# Patient Record
Sex: Female | Born: 1955
Health system: Southern US, Community
[De-identification: ages and names within clinical notes are randomized; demographics above are authoritative.]

---

## 2015-09-30 ENCOUNTER — Other Ambulatory Visit: Payer: Self-pay | Admitting: Family Medicine

## 2015-09-30 ENCOUNTER — Other Ambulatory Visit (HOSPITAL_COMMUNITY)
Admission: RE | Admit: 2015-09-30 | Discharge: 2015-09-30 | Disposition: A | Discharge status: Home / Self Care | Payer: 59 | Source: Ambulatory Visit | Attending: Family Medicine | Admitting: Family Medicine

## 2015-09-30 DIAGNOSIS — Z01411 Encounter for gynecological examination (general) (routine) with abnormal findings: Secondary | ICD-10-CM | POA: Diagnosis present

## 2015-09-30 DIAGNOSIS — Z1151 Encounter for screening for human papillomavirus (HPV): Secondary | ICD-10-CM | POA: Diagnosis not present

## 2015-10-02 LAB — CYTOLOGY - PAP

## 2016-07-20 ENCOUNTER — Encounter: Payer: Self-pay | Admitting: Internal Medicine

## 2016-07-20 ENCOUNTER — Ambulatory Visit (INDEPENDENT_AMBULATORY_CARE_PROVIDER_SITE_OTHER): Payer: PRIVATE HEALTH INSURANCE | Admitting: Internal Medicine

## 2016-07-20 VITALS — BP 120/82 | HR 90 | Ht 66.0 in | Wt 206.0 lb

## 2016-07-20 DIAGNOSIS — E785 Hyperlipidemia, unspecified: Secondary | ICD-10-CM

## 2016-07-20 DIAGNOSIS — I10 Essential (primary) hypertension: Secondary | ICD-10-CM | POA: Diagnosis not present

## 2016-07-20 DIAGNOSIS — Z794 Long term (current) use of insulin: Secondary | ICD-10-CM | POA: Diagnosis not present

## 2016-07-20 DIAGNOSIS — E1165 Type 2 diabetes mellitus with hyperglycemia: Secondary | ICD-10-CM | POA: Diagnosis not present

## 2016-07-20 MED ORDER — EMPAGLIFLOZIN 25 MG PO TABS: 25.0000 mg | ORAL_TABLET | Freq: Every day | ORAL | 5 refills | Status: DC

## 2016-07-20 MED ORDER — SEMAGLUTIDE(0.25 OR 0.5MG/DOS) 2 MG/1.5ML ~~LOC~~ SOPN: 0.5000 mg | PEN_INJECTOR | SUBCUTANEOUS | 5 refills | Status: DC

## 2016-07-20 NOTE — Patient Instructions (Signed)
Please continue: - Lantus 30 units at bedtime.  Continue: - Ozempic 0.25 mg weekly x a total 4 weeks, then increase to 0.5 mg weekly  Start: - Jardiance 25 mg daily before b'fast.  If we cannot use Jardiance, ask your pharmacist about: - Farxiga - Invokana  If we cannot use any of the above, let me know to send Glipizide to your pharmacy.  Please let me know if the sugars are consistently <80 or >200.  Please return in 3 months with your sugar log.   PATIENT INSTRUCTIONS FOR TYPE 2 DIABETES:  **Please join MyChart!** - see attached instructions about how to join if you have not done so already.  DIET AND EXERCISE Diet and exercise is an important part of diabetic treatment.  We recommended aerobic exercise in the form of brisk walking (working between 40-60% of maximal aerobic capacity, similar to brisk walking) for 150 minutes per week (such as 30 minutes five days per week) along with 3 times per week performing 'resistance' training (using various gauge rubber tubes with handles) 5-10 exercises involving the major muscle groups (upper body, lower body and core) performing 10-15 repetitions (or near fatigue) each exercise. Start at half the above goal but build slowly to reach the above goals. If limited by weight, joint pain, or disability, we recommend daily walking in a swimming pool with water up to waist to reduce pressure from joints while allow for adequate exercise.    BLOOD GLUCOSES Monitoring your blood glucoses is important for continued management of your diabetes. Please check your blood glucoses 2-4 times a day: fasting, before meals and at bedtime (you can rotate these measurements - e.g. one day check before the 3 meals, the next day check before 2 of the meals and before bedtime, etc.).   HYPOGLYCEMIA (low blood sugar) Hypoglycemia is usually a reaction to not eating, exercising, or taking too much insulin/ other diabetes drugs.  Symptoms include tremors, sweating,  hunger, confusion, headache, etc. Treat IMMEDIATELY with 15 grams of Carbs: . 4 glucose tablets .  cup regular juice/soda . 2 tablespoons raisins . 4 teaspoons sugar . 1 tablespoon honey Recheck blood glucose in 15 mins and repeat above if still symptomatic/blood glucose <100.  RECOMMENDATIONS TO REDUCE YOUR RISK OF DIABETIC COMPLICATIONS: * Take your prescribed MEDICATION(S) * Follow a DIABETIC diet: Complex carbs, fiber rich foods, (monounsaturated and polyunsaturated) fats * AVOID saturated/trans fats, high fat foods, >2,300 mg salt per day. * EXERCISE at least 5 times a week for 30 minutes or preferably daily.  * DO NOT SMOKE OR DRINK more than 1 drink a day. * Check your FEET every day. Do not wear tightfitting shoes. Contact us if you develop an ulcer * See your EYE doctor once a year or more if needed * Get a FLU shot once a year * Get a PNEUMONIA vaccine once before and once after age 11 years  GOALS:  * Your Hemoglobin A1c of <7%  * fasting sugars need to be <130 * after meals sugars need to be <180 (2h after you start eating) * Your Systolic BP should be 140 or lower  * Your Diastolic BP should be 80 or lower  * Your HDL (Good Cholesterol) should be 40 or higher  * Your LDL (Bad Cholesterol) should be 100 or lower. * Your Triglycerides should be 150 or lower  * Your Urine microalbumin (kidney function) should be <30 * Your Body Mass Index should be 25 or lower  Please consider the following ways to cut down carbs and fat and increase fiber and micronutrients in your diet: - substitute whole grain for white bread or pasta - substitute brown rice for white rice - substitute 90-calorie flat bread pieces for slices of bread when possible - substitute sweet potatoes or yams for white potatoes - substitute humus for margarine - substitute tofu for cheese when possible - substitute almond or rice milk for regular milk (would not drink soy milk daily due to concern for  soy estrogen influence on breast cancer risk) - substitute dark chocolate for other sweets when possible - substitute water - can add lemon or orange slices for taste - for diet sodas (artificial sweeteners will trick your body that you can eat sweets without getting calories and will lead you to overeating and weight gain in the long run) - do not skip breakfast or other meals (this will slow down the metabolism and will result in more weight gain over time)  - can try smoothies made from fruit and almond/rice milk in am instead of regular breakfast - can also try old-fashioned (not instant) oatmeal made with almond/rice milk in am - order the dressing on the side when eating salad at a restaurant (pour less than half of the dressing on the salad) - eat as little meat as possible - can try juicing, but should not forget that juicing will get rid of the fiber, so would alternate with eating raw veg./fruits or drinking smoothies - use as little oil as possible, even when using olive oil - can dress a salad with a mix of balsamic vinegar and lemon juice, for e.g. - use agave nectar, stevia sugar, or regular sugar rather than artificial sweateners - steam or broil/roast veggies  - snack on veggies/fruit/nuts (unsalted, preferably) when possible, rather than processed foods - reduce or eliminate aspartame in diet (it is in diet sodas, chewing gum, etc) Read the labels!  Try to read Dr. Katherina Right book: "Program for Reversing Diabetes" for other ideas for healthy eating.

## 2016-07-20 NOTE — Progress Notes (Signed)
Patient ID: Destiny Munoz, female   DOB: [birth year], 61 y.o.   MRN: [id number]   HPI: Destiny Munoz is a 61 y.o.-year-old female, referred by her PCP, Dr. Kateri Munoz, for management of DM2, dx as GDM - 1995, then as DM2 in 2010, insulin-dependent since 05/2016, uncontrolled, without complications.  Last hemoglobin A1c was: 07/14/2016: HbA1c 10.1% 04/14/2016: HbA1c 11.3% 01/13/2016: HbA1c 10.4% 09/30/2015: HbA1c 10.9% No results found for: HGBA1C  Pt is on a regimen of: - Lantus 30 units at bedtime (last 2 weeks)  - Ozempic 0.25 mg weekly (last 2 weeks) >> samples, tolerates it well She tried metformin R and ER, but it caused GI upset (N/D) She ran out of Glipizide ER 10 mg.  Pt checks her sugars 2x a day and they are: - am: 158, 200-220 - 2h after b'fast: n/c - before lunch: n/c - 2h after lunch: n/c - before dinner: n/c - 2h after dinner: n/c - bedtime: 250-300s - nighttime: n/c No lows. Lowest sugar was 158; she has hypoglycemia awareness at 100.  Highest sugar was 323.  Glucometer: [employer]  Pt's meals are: - Breakfast: protein shake, cheese, sliced meat, toast - break: yoghurt - Lunch: sandwich or hamburger - break: banana - Dinner: chicken/steak + salad + starch - Dessert: cookies She does not like veggies, fruits.  - no CKD, last BUN/creatinine:  07/14/2016: Glucose 213, BUN/creatinine 10/0.67 01/13/2016 UACR <0.7 No results found for: BUN, CREATININE  On lisinopril. - last set of lipids: 09/30/2015: 207/110/49/136 No results found for: CHOL, HDL, LDLCALC, LDLDIRECT, TRIG, CHOLHDL  On pravastatin. - last eye exam was in Spring 2017. No DR.  - no numbness and tingling in her feet. She had a normal foot exam by PCP 07/14/2016.  Pt has FH of DM in mother.  ROS: Constitutional: no weight gain/loss, + fatigue, no subjective hyperthermia/hypothermia, + increased urination Eyes: no blurry vision, no xerophthalmia ENT: no sore throat, no nodules palpated in  throat, no dysphagia/odynophagia, no hoarseness Cardiovascular: no CP/SOB/palpitations/leg swelling Respiratory: no cough/SOB Gastrointestinal: no N/V/D/C/+ heartburn Musculoskeletal: no muscle/joint aches Skin: no rashes Neurological: no tremors/numbness/tingling/dizziness, + HA Psychiatric:+ depression/no anxiety  PMH: - DM2 - HTN - HL - Depression  No past surgical history on file.   Social History   Social History  . Marital status: single    Spouse name: N/A  . Number of children: 5   Occupational History  . Insurance documentation specialist   Social History Main Topics  . Smoking status: No  . Smokeless tobacco: No  . Alcohol use Wine, 2x weekly  . Drug use: No   Current Outpatient Prescriptions  Medication Sig Dispense Refill  . insulin glargine (LANTUS) 100 UNIT/ML injection Inject 35 Units into the skin at bedtime.    Marland Kitchen lisinopril (PRINIVIL,ZESTRIL) 10 MG tablet Take 10 mg by mouth daily.    . pravastatin (PRAVACHOL) 10 MG tablet Take 10 mg by mouth daily.    . Semaglutide (OZEMPIC) 0.25 or 0.5 MG/DOSE SOPN Inject 0.25 mg into the skin once a week.     No current facility-administered medications for this visit.    No Known Allergies   Family History  Problem Relation Age of Onset  . Diabetes Mother   . Hypertension Mother   . Hyperlipidemia Mother   . Heart disease Mother   . Hypertension Father   . Hyperlipidemia Father   . Heart disease Father   . Cancer Father   . Cancer Sister     PE:  BP 120/82 (BP Location: Left Arm, Patient Position: Sitting)   Pulse 90   Ht  (1.676 m)   Wt 206 lb (93.4 kg)   SpO2 94%   BMI 33.25 kg/m  Wt Readings from Last 3 Encounters:  07/20/16 206 lb (93.4 kg)   Constitutional: obese, in NAD Eyes: PERRLA, EOMI, no exophthalmos ENT: moist mucous membranes, no thyromegaly, no cervical lymphadenopathy Cardiovascular: RRR, No MRG Respiratory: CTA B Gastrointestinal: abdomen soft, NT, ND,  BS+ Musculoskeletal: no deformities, strength intact in all 4 Skin: moist, warm, no rashes Neurological: no tremor with outstretched hands, DTR normal in all 4  ASSESSMENT: 1. DM2, insulin-dependent, uncontrolled, without long term complications, but with hyperglycemia  PLAN:  1. Patient with long-standing, uncontrolled diabetes, previously only on oral antidiabetic regimen, which was insufficient, however, she recently accepted to start insulin and GLP 1 receptor agonist. She is on Lantus and Ozempic. Her sugars remained very high, in the 200-300 range, correlating with her very high HbA1c. - I advised her to continue on the same dose of Lantus, but increase Ozempic dose and start Jardiance. - we discussed about SEs of Jardiance, which are: dizziness (advised to be careful when stands from sitting position), decreased BP - usually not < normal (BP today is not low), and fungal UTIs (advised to let me know if develops one).  - given discount card for Jardiance and for Ozempic - We also discussed about improving diet. - I suggested to:  Patient Instructions  Please continue: - Lantus 30 units at bedtime.  Continue: - Ozempic 0.25 mg weekly x a total 4 weeks, then increase to 0.5 mg weekly  Start: - Jardiance 25 mg daily before b'fast.  If we cannot use Jardiance, ask your pharmacist about: - Farxiga - Invokana  If we cannot use any of the above, let me know to send Glipizide to your pharmacy.  Please let me know if the sugars are consistently <80 or >200.  Please return in 3 months with your sugar log.   - Continue checking sugars at different times of the day - check 2 times a day, rotating checks - given sugar log and advised how to fill it and to bring it at next appt  - given foot care handout and explained the principles  - given instructions for hypoglycemia management "15-15 rule"  - advised for yearly eye exams  - Return to clinic in 3 mo with sugar log   Carlus Pavlov, MD PhD [employer] [facility]

## 2016-11-10 ENCOUNTER — Encounter: Payer: Self-pay | Admitting: Internal Medicine

## 2016-11-10 ENCOUNTER — Ambulatory Visit (INDEPENDENT_AMBULATORY_CARE_PROVIDER_SITE_OTHER): Payer: PRIVATE HEALTH INSURANCE | Admitting: Internal Medicine

## 2016-11-10 VITALS — BP 132/82 | HR 81 | Ht 66.0 in | Wt 205.0 lb

## 2016-11-10 DIAGNOSIS — Z794 Long term (current) use of insulin: Secondary | ICD-10-CM | POA: Diagnosis not present

## 2016-11-10 DIAGNOSIS — E1165 Type 2 diabetes mellitus with hyperglycemia: Secondary | ICD-10-CM

## 2016-11-10 LAB — POCT GLYCOSYLATED HEMOGLOBIN (HGB A1C): HEMOGLOBIN A1C: 7.9

## 2016-11-10 NOTE — Patient Instructions (Addendum)
Please continue: - Novolin N but split the dose to 15 units before b'fast and 20 before bedtime   Please increase: - Ozempic to 0.5 mg weekly   Please return in 3 months with your sugar log.

## 2016-11-10 NOTE — Progress Notes (Signed)
Patient ID: Destiny Munoz, female   DOB: 1955/09/25, 61 y.o.   MRN: [id number]   HPI: Radha Lager is a 61 y.o.-year-old female, returning for follow-up for DM2, dx as GDM - 1995, then as DM2 in 2010, insulin-dependent since 05/2016, uncontrolled, without long-term complications. Last visit 3.5 mo ago.  She had a URI in July >> sugars higher >> now much better.  Last hemoglobin A1c was: 07/14/2016: HbA1c 10.1% 04/14/2016: HbA1c 11.3% 01/13/2016: HbA1c 10.4% 09/30/2015: HbA1c 10.9% No results found for: HGBA1C  Pt was on a regimen of: - Lantus 30 units at bedtime (last 2 weeks)  - Ozempic 0.25 mg weekly (last 2 weeks) >> samples, tolerates it well She tried metformin R and ER, but it caused GI upset (N/D) She ran out of Glipizide ER 10 mg.  At last visit, we changed to: - Lantus 30 units at bedtime >> Novolin N 35 units at bedtime (had to change b/c $) - Ozempic 0.25 alternating 0.5 mg weekly (did not understand to stay on the higher dose) We tried Jardiance 25 mg >> $.  Pt checks her sugars 2x a day and they are better in last month: - am: 158, 200-220 >> 108-158 (but 240-270 when sick) - 2h after b'fast: n/c - before lunch: n/c - 2h after lunch: n/c - before dinner: n/c >> 120-127 - 2h after dinner: n/c - bedtime: 250-300s >> 148-246 - nighttime: n/c No lows. Lowest sugar was 158 >> 108; she has hypoglycemia awareness at 100.  Highest sugar was 323 >> 489.  Glucometer: [employer]  Pt's meals are: - Breakfast: protein shake, cheese, sliced meat, toast - break: yoghurt - Lunch: sandwich or hamburger - break: banana - Dinner: chicken/steak + salad + starch - Dessert: cookies She does not like veggies, fruits.  - No CKD, last BUN/creatinine:  07/14/2016: Glucose 213, BUN/creatinine 10/0.67 01/13/2016 UACR <0.7 No results found for: BUN, CREATININE  On Lisinopril. - last set of lipids: Had labs 1 mo ago >> will ask for records from PCP 09/30/2015:  207/110/49/136 No results found for: CHOL, HDL, LDLCALC, LDLDIRECT, TRIG, CHOLHDL  On Pravastatin increased from 10 to 20 mg. - last eye exam was in Spring 2017 >> No DR.  - denies numbness and tingling in her feet. She had a normal foot exam by PCP 07/14/2016.  Pt has FH of DM in mother.  ROS: Constitutional: no weight gain/no weight loss, + fatigue, no subjective hyperthermia, no subjective hypothermia Eyes: no blurry vision, no xerophthalmia ENT: + sore throat, no nodules palpated in throat, no dysphagia, no odynophagia, + hoarseness Cardiovascular: no CP/no SOB/no palpitations/+ leg swelling Respiratory: + cough/no SOB/no wheezing Gastrointestinal: no N/no V/no D/no C/no acid reflux Musculoskeletal: + muscle aches/no joint aches Skin: no rashes, no hair loss Neurological: no tremors/no numbness/no tingling/no dizziness, + HA  I reviewed pt's medications, allergies, PMH, social hx, family hx, and changes were documented in the history of present illness. Otherwise, unchanged from my initial visit note.  PMH: - DM2 - HTN - HL - Depression  No past surgical history on file.   Social History   Social History  . Marital status: single    Spouse name: N/A  . Number of children: 5   Occupational History  . Insurance documentation specialist   Social History Main Topics  . Smoking status: No  . Smokeless tobacco: No  . Alcohol use Wine, 2x weekly  . Drug use: No   Current Outpatient Prescriptions  Medication Sig  Dispense Refill  . insulin NPH Human (HUMULIN N,NOVOLIN N) 100 UNIT/ML injection Inject 35 Units into the skin.    Marland Kitchen lisinopril (PRINIVIL,ZESTRIL) 10 MG tablet Take 10 mg by mouth daily.    . pravastatin (PRAVACHOL) 10 MG tablet Take 20 mg by mouth daily.     . Semaglutide (OZEMPIC) 0.25 or 0.5 MG/DOSE SOPN Inject 0.5 mg into the skin once a week. 2 pen 5  . empagliflozin (JARDIANCE) 25 MG TABS tablet Take 25 mg by mouth daily. (Patient not taking: Reported on  11/10/2016) 30 tablet 5  . insulin glargine (LANTUS) 100 UNIT/ML injection Inject 35 Units into the skin at bedtime.     No current facility-administered medications for this visit.    No Known Allergies   Family History  Problem Relation Age of Onset  . Diabetes Mother   . Hypertension Mother   . Hyperlipidemia Mother   . Heart disease Mother   . Hypertension Father   . Hyperlipidemia Father   . Heart disease Father   . Cancer Father   . Cancer Sister     PE: BP 132/82 (BP Location: Left Arm, Patient Position: Sitting)   Pulse 81   Ht 5\' 6"  (1.676 m)   Wt 205 lb (93 kg)   SpO2 96%   BMI 33.09 kg/m  Wt Readings from Last 3 Encounters:  11/10/16 205 lb (93 kg)  07/20/16 206 lb (93.4 kg)   Constitutional: obese, in NAD Eyes: PERRLA, EOMI, no exophthalmos ENT: moist mucous membranes, no thyromegaly, no cervical lymphadenopathy Cardiovascular: RRR, + 1/6 SEM, no RG Respiratory: CTA B Gastrointestinal: abdomen soft, NT, ND, BS+ Musculoskeletal: no deformities, strength intact in all 4 Skin: moist, warm, no rashes Neurological: no tremor with outstretched hands, DTR normal in all 4  ASSESSMENT: 1. DM2, insulin-dependent, uncontrolled, without long term complications, but with hyperglycemia  2. HL  PLAN:  1. Patient with long-standing, uncontrolled DM, much improved since last visit except for a period of 1 mo in July, whe she had a URI >> sugars got to 300s and even had 1x 400. - she could not obtain the recommended SGLT2 inh b/c cost >> not necessary for now. - she could not continue Lantus b/c cost >> now on Novolin N but takes it all at night >> explained that this is a 2x a day insulin >> will split the dose - also, she was alternating b/w 0.25 and 0.5 mg Ozempic >> will increase the dose to only 0.5 mg weekly and move this to am as she was taking it at night - I suggested to:  Patient Instructions  Please continue: - Novolin N but split the dose to 15 units before  b'fast and 20 before bedtime   Please increase: - Ozempic to 0.5 mg weekly   Please return in 3 months with your sugar log.   - today, HbA1c is 7.9% (great decrease!) - continue checking sugars at different times of the day - check 2x a day, rotating checks - advised for yearly eye exams >> she is due - Return to clinic in 3 mo with sugar log   2. HL  - had recent Lipid panel by PCP >> will try to obtain records - her Pravastatin was increased from 10 >> 20 mg per day.  Reviewed records from PCP: 10/04/2016:  Lipids: 178/90/42/117 BUN/Cr 10/0.76, Glu 277  Carlus Pavlov, MD PhD [facility]

## 2016-11-10 NOTE — Addendum Note (Signed)
Addended by: Darene Lamer T on: 11/10/2016 08:51 AM   Modules accepted: Orders

## 2017-02-13 ENCOUNTER — Ambulatory Visit (INDEPENDENT_AMBULATORY_CARE_PROVIDER_SITE_OTHER): Payer: PRIVATE HEALTH INSURANCE | Admitting: Internal Medicine

## 2017-02-13 ENCOUNTER — Encounter: Payer: Self-pay | Admitting: Internal Medicine

## 2017-02-13 VITALS — BP 124/80 | HR 85 | Wt 208.0 lb

## 2017-02-13 DIAGNOSIS — Z794 Long term (current) use of insulin: Secondary | ICD-10-CM

## 2017-02-13 DIAGNOSIS — E1165 Type 2 diabetes mellitus with hyperglycemia: Secondary | ICD-10-CM | POA: Diagnosis not present

## 2017-02-13 DIAGNOSIS — E785 Hyperlipidemia, unspecified: Secondary | ICD-10-CM | POA: Insufficient documentation

## 2017-02-13 LAB — POCT GLYCOSYLATED HEMOGLOBIN (HGB A1C): Hemoglobin A1C: 6.7

## 2017-02-13 NOTE — Progress Notes (Signed)
Patient ID: Destiny Munoz, female   DOB: 1955/11/06, 61 y.o.   MRN: [id number]   HPI: Destiny Munoz is a 61 y.o.-year-old female, returning for follow-up for DM2, dx as GDM - 1995, then as DM2 in 2010, insulin-dependent since 05/2016, uncontrolled, without long-term complications. Last visit 3 mo ago.  Last hemoglobin A1c was: Lab Results  Component Value Date   HGBA1C 7.9 11/10/2016  07/14/2016: HbA1c 10.1% 04/14/2016: HbA1c 11.3% 01/13/2016: HbA1c 10.4% 09/30/2015: HbA1c 10.9%  Pt was on a regimen of: - Lantus 30 units at bedtime (last 2 weeks)  - Ozempic 0.25 mg weekly (last 2 weeks) >> samples, tolerates it well She tried metformin R and ER, but it caused GI upset (N/D) She ran out of Glipizide ER 10 mg.  At last visit, we changed to: - Lantus 30 units at bedtime >> Novolin N 35 units at bedtime (had to change b/c $) >> 15 units in am and 20 units at bedtime - Ozempic 0.25 alternating 0.5 mg weekly (did not understand to stay on the higher dose) >> stopped as ran out We tried Jardiance 25 mg >> $.  Pt checks her sugars 2x a day: - am: 158, 200-220 >> 108-158 (but 240-270 when sick) >> 118-166, 194 - 2h after b'fast: n/c - before lunch: n/c >> 11/2016: 107-129 - 2h after lunch: n/c - before dinner: n/c >> 120-127 >> n/c - 2h after dinner: n/c - bedtime: 250-300s >> 148-246 >> 153-275 - nighttime: n/c Lowest sugar was 158 >> 108 >> 118; she has hypoglycemia awareness at 100.  Highest sugar was 323 >> 489 >> 275.  Glucometer: [employer]  Pt's meals are: - Breakfast: protein shake, cheese, sliced meat, toast - break: yoghurt - Lunch: sandwich or hamburger - break: banana - Dinner: chicken/steak + salad + starch - Dessert: cookies She does not like veggies, fruits.  - No CKD, last BUN/creatinine:  07/14/2016: Glucose 213, BUN/creatinine 10/0.67 01/13/2016; UACR <0.7 No results found for: BUN, CREATININE  On Lisinopril. - last set of lipids: 10/04/2016:   Lipids: 178/90/42/117 BUN/Cr 10/0.76, Glu 277 09/30/2015: 207/110/49/136 No results found for: CHOL, HDL, LDLCALC, LDLDIRECT, TRIG, CHOLHDL  On Pravastatin. - last eye exam was in Spring 2017 >> No DR.  - denies numbness and tingling in her feet. She had a normal foot exam by PCP 07/14/2016.  Pt has FH of DM in mother.  ROS: Constitutional: no weight gain/no weight loss, + fatigue, no subjective hyperthermia, no subjective hypothermia Eyes: no blurry vision, no xerophthalmia ENT: no sore throat, no nodules palpated in throat, no dysphagia, no odynophagia, no hoarseness Cardiovascular: no CP/no SOB/no palpitations/no leg swelling Respiratory: no cough/no SOB/no wheezing Gastrointestinal: + N/+ V/no D/no C/no acid reflux Musculoskeletal: no muscle aches/no joint aches Skin: no rashes, no hair loss Neurological: no tremors/no numbness/no tingling/no dizziness  I reviewed pt's medications, allergies, PMH, social hx, family hx, and changes were documented in the history of present illness. Otherwise, unchanged from my initial visit note.   PMH: - DM2 - HTN - HL - Depression  No past surgical history on file.   Social History   Social History  . Marital status: single    Spouse name: N/A  . Number of children: 5   Occupational History  . Insurance documentation specialist   Social History Main Topics  . Smoking status: No  . Smokeless tobacco: No  . Alcohol use Wine, 2x weekly  . Drug use: No   Current Outpatient Medications  Medication Sig Dispense Refill  . insulin NPH Human (HUMULIN N,NOVOLIN N) 100 UNIT/ML injection Inject 35 Units into the skin.    Marland Kitchen. lisinopril (PRINIVIL,ZESTRIL) 10 MG tablet Take 10 mg by mouth daily.    . pravastatin (PRAVACHOL) 10 MG tablet Take 20 mg by mouth daily.     . Semaglutide (OZEMPIC) 0.25 or 0.5 MG/DOSE SOPN Inject 0.5 mg into the skin once a week. (Patient not taking: Reported on 02/13/2017) 2 pen 5   No current  facility-administered medications for this visit.    No Known Allergies   Family History  Problem Relation Age of Onset  . Diabetes Mother   . Hypertension Mother   . Hyperlipidemia Mother   . Heart disease Mother   . Hypertension Father   . Hyperlipidemia Father   . Heart disease Father   . Cancer Father   . Cancer Sister     PE: BP 124/80 (BP Location: Left Arm, Patient Position: Sitting)   Pulse 85   Wt 208 lb (94.3 kg)   SpO2 97%   BMI 33.57 kg/m  Wt Readings from Last 3 Encounters:  02/13/17 208 lb (94.3 kg)  11/10/16 205 lb (93 kg)  07/20/16 206 lb (93.4 kg)   Constitutional: overweight, in NAD Eyes: PERRLA, EOMI, no exophthalmos ENT: moist mucous membranes, no thyromegaly, no cervical lymphadenopathy Cardiovascular: RRR, No RG, + 1/6 SEM Respiratory: CTA B Gastrointestinal: abdomen soft, NT, ND, BS+ Musculoskeletal: no deformities, strength intact in all 4 Skin: moist, warm, no rashes Neurological: no tremor with outstretched hands, DTR normal in all 4  ASSESSMENT: 1. DM2, insulin-dependent, uncontrolled, without long term complications, but with hyperglycemia  2. HL  PLAN:  1. Patient with long standing, uncontrolled DM, with better ctrl after starting Ozempic, but unfortunately she ran out of this last mo >> sugars progressively higher (even high 200s), especially in the evening. For now, will increase am insulin N and when ready to restart the GLP1 R agonist (after the 1st of the year) >> will reduce the dose back. - I suggested to:  Patient Instructions  Please change NPH insulin dose: - 20 units 2x a day  When you are ready to start Ozempic, start at 0.25 mg weekly x 2-4 weeks, then increase to 0.5 mg weekly. In the same time, decrease am NPH back to 15 units.  Please return in 3 months with your sugar log.   - today, HbA1c is 6.7% (great!) - continue checking sugars at different times of the day - check 1x a day, rotating checks - advised for  yearly eye exams >> she is not UTD - Return to clinic in 3 mo with sugar log   2. HL  - Reviewed recent Lipids by PCP >> all fractions better! - continue Pravastatin 20 >> no SEs  Carlus Pavlovristina Rashena Dowling, MD PhD [facility]

## 2017-02-13 NOTE — Addendum Note (Signed)
Addended by: Darene LamerHOMPSON, Zariana Strub T on: 02/13/2017 09:17 AM   Modules accepted: Orders

## 2017-02-13 NOTE — Patient Instructions (Signed)
Please change NPH insulin dose: - 20 units 2x a day  When you are ready to start Ozempic, start at 0.25 mg weekly x 2-4 weeks, then increase to 0.5 mg weekly. In the same time, decrease am NPH back to 15 units.  Please return in 3 months with your sugar log.

## 2017-05-16 ENCOUNTER — Encounter: Payer: Self-pay | Admitting: Internal Medicine

## 2017-05-16 ENCOUNTER — Ambulatory Visit (INDEPENDENT_AMBULATORY_CARE_PROVIDER_SITE_OTHER): Payer: PRIVATE HEALTH INSURANCE | Admitting: Internal Medicine

## 2017-05-16 VITALS — BP 136/78 | HR 104 | Ht 66.0 in | Wt 216.8 lb

## 2017-05-16 DIAGNOSIS — E785 Hyperlipidemia, unspecified: Secondary | ICD-10-CM

## 2017-05-16 DIAGNOSIS — Z794 Long term (current) use of insulin: Secondary | ICD-10-CM | POA: Diagnosis not present

## 2017-05-16 DIAGNOSIS — E1165 Type 2 diabetes mellitus with hyperglycemia: Secondary | ICD-10-CM

## 2017-05-16 LAB — POCT GLYCOSYLATED HEMOGLOBIN (HGB A1C): Hemoglobin A1C: 8.1

## 2017-05-16 MED ORDER — SEMAGLUTIDE(0.25 OR 0.5MG/DOS) 2 MG/1.5ML ~~LOC~~ SOPN: 0.5000 mg | PEN_INJECTOR | SUBCUTANEOUS | 5 refills | Status: DC

## 2017-05-16 NOTE — Patient Instructions (Addendum)
Please continue NPH insulin dose: - 20 units 2x a day  Start Ozempic at 0.25 mg weekly x 2-4 weeks, then increase to 0.5 mg weekly. In the same time, decrease am NPH back to 15 units 2x a day.  Please return in 3 months with your sugar log.

## 2017-05-16 NOTE — Progress Notes (Signed)
Patient ID: Destiny Munoz, female   DOB: 29-Dec-1955, 62 y.o.   MRN: [id number]   HPI: Destiny Munoz is a 62 y.o.-year-old female, returning for follow-up for DM2, dx as GDM - 1995, then as DM2 in 2010, insulin-dependent since 05/2016, uncontrolled, without long-term complications. Last visit 3 months ago.  Last hemoglobin A1c was: Lab Results  Component Value Date   HGBA1C 6.7 02/13/2017   HGBA1C 7.9 11/10/2016  07/14/2016: HbA1c 10.1% 04/14/2016: HbA1c 11.3% 01/13/2016: HbA1c 10.4% 09/30/2015: HbA1c 10.9%  Pt was on a regimen of: - Lantus 30 units at bedtime - Ozempic 0.25 mg weekly She tried metformin R and ER, but it caused GI upset (N/D) She ran out of Glipizide ER 10 mg.  She is now on: - Lantus 30 units at bedtime >> Novolin N 35 units at bedtime (had to change b/c $) >> 20 units 2x a day  Did not restart as I advised her at last visit We tried Jardiance 25 mg >> $.  Pt checks her sugars 2x a day: - am: 108-158 (but 240-270 when sick) >> 118-166, 194 >> 144-160 - 2h after b'fast: n/c - before lunch: n/c >> 11/2016: 107-129 >> n/c - 2h after lunch: n/c - before dinner: n/c >> 120-127 >> n/c - 2h after dinner: n/c - bedtime: 250-300s >> 148-246 >> 153-275 >> 260s - nighttime: n/c Lowest sugar was 158 >> 108 >> 118 >> 128; she has hypoglycemia awareness at 100. Highest sugar was 323 >> 489 >> 275 >> 300s after meals.  Glucometer: [employer]  Pt's meals are: - Breakfast: protein shake, cheese, sliced meat, toast - break: yoghurt - Lunch: sandwich or hamburger - break: banana - Dinner: chicken/steak + salad + starch - Dessert: cookies She does not like veggies, fruits.  -No CKD, last BUN/creatinine:  07/14/2016: Glucose 213, BUN/creatinine 10/0.67 01/13/2016; UACR <0.7 No results found for: BUN, CREATININE  On lisinopril. -+ HL; last set of lipids: 10/04/2016:  Lipids: 178/90/42/117 BUN/Cr 10/0.76, Glu 277 09/30/2015: 207/110/49/136 No results found  for: CHOL, HDL, LDLCALC, LDLDIRECT, TRIG, CHOLHDL  On pravastatin. - last eye exam was in spring 2017: No DR - No numbness and tingling in her feet. She had a normal foot exam by PCP 07/14/2016.  ROS: Constitutional: + weight gain/no weight loss, + fatigue, no subjective hyperthermia, no subjective hypothermia Eyes: no blurry vision, no xerophthalmia ENT: + sore throat, no nodules palpated in throat, no dysphagia, no odynophagia, no hoarseness Cardiovascular: no CP/no SOB/no palpitations/no leg swelling Respiratory: + cough/no SOB/no wheezing Gastrointestinal: no N/no V/no D/no C/no acid reflux Musculoskeletal: no muscle aches/+ joint aches Skin: no rashes, no hair loss Neurological: no tremors/no numbness/no tingling/no dizziness  I reviewed pt's medications, allergies, PMH, social hx, family hx, and changes were documented in the history of present illness. Otherwise, unchanged from my initial visit note.  PMH: - DM2 - HTN - HL - Depression  No past surgical history on file.   Social History   Social History  . Marital status: single    Spouse name: N/A  . Number of children: 5   Occupational History  . Insurance documentation specialist   Social History Main Topics  . Smoking status: No  . Smokeless tobacco: No  . Alcohol use Wine, 2x weekly  . Drug use: No   Current Outpatient Medications  Medication Sig Dispense Refill  . insulin NPH Human (HUMULIN N,NOVOLIN N) 100 UNIT/ML injection Inject 35 Units into the skin.    Marland Kitchen lisinopril (  PRINIVIL,ZESTRIL) 10 MG tablet Take 10 mg by mouth daily.    . pravastatin (PRAVACHOL) 10 MG tablet Take 20 mg by mouth daily.     . Semaglutide (OZEMPIC) 0.25 or 0.5 MG/DOSE SOPN Inject 0.5 mg into the skin once a week. (Patient not taking: Reported on 02/13/2017) 2 pen 5   No current facility-administered medications for this visit.    No Known Allergies   Family History  Problem Relation Age of Onset  . Diabetes Mother   .  Hypertension Mother   . Hyperlipidemia Mother   . Heart disease Mother   . Hypertension Father   . Hyperlipidemia Father   . Heart disease Father   . Cancer Father   . Cancer Sister    Pt has FH of DM in mother.  PE: BP 136/78   Pulse (!) 104   Ht 5\' 6"  (1.676 m)   Wt 216 lb 12.8 oz (98.3 kg)   BMI 34.99 kg/m  Wt Readings from Last 3 Encounters:  05/16/17 216 lb 12.8 oz (98.3 kg)  02/13/17 208 lb (94.3 kg)  11/10/16 205 lb (93 kg)   Constitutional: overweight, in NAD Eyes: PERRLA, EOMI, no exophthalmos ENT: moist mucous membranes, no thyromegaly, no cervical lymphadenopathy Cardiovascular: tachycardia, RR, No RG, + 1/6 SEM Respiratory: CTA B Gastrointestinal: abdomen soft, NT, ND, BS+ Musculoskeletal: no deformities, strength intact in all 4 Skin: moist, warm, no rashes Neurological: no tremor with outstretched hands, DTR normal in all 4  ASSESSMENT: 1. DM2, insulin-dependent, uncontrolled, without long term complications, but with hyperglycemia  2. HL  PLAN:  1. Patient with long-standing, uncontrolled, diabetes, with with better control after starting Ozempic,  however, she was off the medication at last visit as she ran out and her sugars increase significantly especially in the evening.  We increased her NPH insulin until she was ready to restart the GLP-1 receptor agonist. However, she did not restart as advised at last visit  - she cont'd only on NPH. Sugars are higher and she gained weight >> will start Ozempic now and try to reduce the NPH doses. - I suggested to:  Patient Instructions  Please continue NPH insulin dose: - 20 units 2x a day  Start Ozempic at 0.25 mg weekly x 2-4 weeks, then increase to 0.5 mg weekly. In the same time, decrease am NPH back to 15 units 2x a day.  Please return in 3 months with your sugar log.   - today, HbA1c is 8.1% (higher) - continue checking sugars at different times of the day - check 2x a day, rotating checks - advised  for yearly eye exams >> she is not UTD - Return to clinic in 3 mo with sugar log    2. HL  - Reviewed lipid panel obtained by PCP in 09/2016: All fractions are better, however, LDL is still above goal  - She continues pravastatin 20 without side effects  Carlus Pavlovristina Valinda Fedie, MD PhD [facility]

## 2017-08-16 ENCOUNTER — Encounter: Payer: Self-pay | Admitting: Internal Medicine

## 2017-08-16 ENCOUNTER — Ambulatory Visit (INDEPENDENT_AMBULATORY_CARE_PROVIDER_SITE_OTHER): Payer: PRIVATE HEALTH INSURANCE | Admitting: Internal Medicine

## 2017-08-16 VITALS — BP 140/80 | HR 76 | Ht 66.0 in | Wt 217.0 lb

## 2017-08-16 DIAGNOSIS — E1165 Type 2 diabetes mellitus with hyperglycemia: Secondary | ICD-10-CM | POA: Diagnosis not present

## 2017-08-16 DIAGNOSIS — E785 Hyperlipidemia, unspecified: Secondary | ICD-10-CM

## 2017-08-16 DIAGNOSIS — Z794 Long term (current) use of insulin: Secondary | ICD-10-CM | POA: Diagnosis not present

## 2017-08-16 LAB — POCT GLYCOSYLATED HEMOGLOBIN (HGB A1C): Hemoglobin A1C: 7.9 % — AB (ref 4.0–5.6)

## 2017-08-16 NOTE — Progress Notes (Signed)
Patient ID: Destiny Munoz, female   DOB: 12/03/1955, 62 y.o.   MRN: [id number]   HPI: Destiny Munoz is a 62 y.o.-year-old female, returning for follow-up for DM2, dx as GDM - 1995, then as DM2 in 2010, insulin-dependent since 05/2016, uncontrolled, without long-term complications. Last visit 3 months ago.  She started to change her diet: no sweet drinks, no french fries. She skipped more meals in 06/2017 >> sugars better then.  However, this month, they are higher, especially in the morning.  Last hemoglobin A1c was: Lab Results  Component Value Date   HGBA1C 8.1 05/16/2017   HGBA1C 6.7 02/13/2017   HGBA1C 7.9 11/10/2016  07/14/2016: HbA1c 10.1% 04/14/2016: HbA1c 11.3% 01/13/2016: HbA1c 10.4% 09/30/2015: HbA1c 10.9%  She is on: - NPH 15 units in am and 20 units at night She could not start Ozempic 0.5 mg weekly b/c price >> 750$ We tried Jardiance 25 mg >> $. She tried metformin R and ER, but it caused GI upset (N/D) She ran out of Glipizide ER 10 mg.   She was on Lantus 30 units at bedtime in the past but could not afford it anymore   Pt checks her sugars 2X a day: - am: 118-166, 194 >> 144-160 >> 104, 120-160 - 2h after b'fast: n/c - before lunch:  107-129 >> n/c - 2h after lunch: n/c - before dinner: n/c >> 120-127 >> n/c - 2h after dinner: n/c - bedtime: 148-246 >> 153-275 >> 260s >> 150-180 - nighttime: n/c Lowest sugar was 128 >> 104; she has hypoglycemia awareness at 100. Highest sugar was 300s >> 298.  Glucometer: [employer]  Pt's meals are: - Breakfast: protein shake, cheese, sliced meat, toast - break: yoghurt - Lunch: sandwich or hamburger - break: banana - Dinner: chicken/steak + salad + starch - Dessert: cookies She does not like veggies, fruits.  - no CKD, last BUN/creatinine:  10/04/2016: BUN/Cr 10/0.76, Glu 277 07/14/2016: BUN/Cr 10/0.67, Glu 213 01/13/2016: UACR <0.7 No results found for: BUN, CREATININE  On lisinopril. - + HL; last set of  lipids: 10/04/2016: 178/90/42/117 09/30/2015: 207/110/49/136 No results found for: CHOL, HDL, LDLCALC, LDLDIRECT, TRIG, CHOLHDL  On pravastatin. - last eye exam was in s spring 2017: No DR. she will have new insurance soon and can schedule this. - no numbness and tingling in her feet. She had a normal foot exam by PCP 06/2016.  She had a recent appointment with a podiatrist for a callus.  ROS: Constitutional: no weight gain/no weight loss,+ fatigue, no subjective hyperthermia, no subjective hypothermia Eyes: no blurry vision, no xerophthalmia ENT: no sore throat, no nodules palpated in throat, no dysphagia, no odynophagia, no hoarseness Cardiovascular: no CP/no SOB/no palpitations/+ leg swelling Respiratory: + cough/no SOB/no wheezing Gastrointestinal: no N/no V/no D/no C/+ acid reflux Musculoskeletal: no muscle aches/no joint aches Skin: + rash - poison ivy, no hair loss Neurological: no tremors/no numbness/no tingling/no dizziness  I reviewed pt's medications, allergies, PMH, social hx, family hx, and changes were documented in the history of present illness. Otherwise, unchanged from my initial visit note. PMH: - DM2 - HTN - HL - Depression  History reviewed. No pertinent surgical history.   Social History   Social History  . Marital status: single    Spouse name: N/A  . Number of children: 5   Occupational History  . Insurance documentation specialist   Social History Main Topics  . Smoking status: No  . Smokeless tobacco: No  . Alcohol use Wine, 2x  weekly  . Drug use: No   Current Outpatient Medications  Medication Sig Dispense Refill  . insulin NPH Human (HUMULIN N,NOVOLIN N) 100 UNIT/ML injection Inject 35 Units into the skin.    Marland Kitchen lisinopril (PRINIVIL,ZESTRIL) 10 MG tablet Take 10 mg by mouth daily.    . pravastatin (PRAVACHOL) 10 MG tablet Take 20 mg by mouth daily.     . Semaglutide (OZEMPIC) 0.25 or 0.5 MG/DOSE SOPN Inject 0.5 mg into the skin once a week.  2 pen 5   No current facility-administered medications for this visit.    No Known Allergies   Family History  Problem Relation Age of Onset  . Diabetes Mother   . Hypertension Mother   . Hyperlipidemia Mother   . Heart disease Mother   . Hypertension Father   . Hyperlipidemia Father   . Heart disease Father   . Cancer Father   . Cancer Sister    Pt has FH of DM in mother.  PE: BP 140/80 (BP Location: Right Arm, Patient Position: Sitting, Cuff Size: Large)   Pulse 76   Ht  (1.676 m)   Wt 217 lb (98.4 kg)   SpO2 95%   BMI 35.02 kg/m  Wt Readings from Last 3 Encounters:  08/16/17 217 lb (98.4 kg)  05/16/17 216 lb 12.8 oz (98.3 kg)  02/13/17 208 lb (94.3 kg)   Constitutional: overweight, in NAD Eyes: PERRLA, EOMI, no exophthalmos ENT: moist mucous membranes, no thyromegaly, no cervical lymphadenopathy Cardiovascular: RRR, No RG, + 1/6 SEM Respiratory: CTA B Gastrointestinal: abdomen soft, NT, ND, BS+ Musculoskeletal: no deformities, strength intact in all 4 Skin: moist, warm, no rashes Neurological: no tremor with outstretched hands, DTR normal in all 4  ASSESSMENT: 1. DM2, insulin-dependent, uncontrolled, without long te obesity rm complications, but with hyperglycemia  2. HL  3.  Obesity  PLAN:  1. Patient with Long-standing, uncontrolled, type 2 diabetes, with better control after starting Ozempic, but she was off the medication at last visit as she ran out, so sugars were higher.  At last visit, I advised her to restart ed Ozempic and reduced her NPH doses.  However, she could not restudy due to price.  She is in the process of changing insurances after she started a new job and she believes that the new insurance is [employer].  She will most likely be able to afford Ozempic in approximately 1 more month. - As sugars are higher in the last month, I would suggest to start Ozempic back.  We again discussed about how this works.  She agrees to  restart it.  For now, since sugars are higher, will increase NPH but I advised her to decrease it back when starting Ozempic. - I suggested to:  Patient Instructions  Please increase: - NPH 20 units in am and 25 units at night  Start Ozempic at 0.25 mg weekly x 2-4 weeks, then increase to 0.5 mg weekly.  In the same time, decrease am NPH back to 15 units 2x a day.  Please return in 3-4 months with your sugar log.   - today, HbA1c is 7.9% (slightly better) - continue checking sugars at different times of the day - check 1x a day, rotating checks - advised for yearly eye exams >> she is UTD - Return to clinic in 3-4 mo with sugar log    2. HL  - Reviewed latest lipid panel from 09/2016, all fractions are lower, however, LDL is  still above goal - Continues pravastatin 20 without side effects.  3.  Obesity -Weight is stable  -We will start Ozempic which should greatly help with this, also  Carlus Pavlov, MD PhD [facility] End[facility]

## 2017-08-16 NOTE — Patient Instructions (Addendum)
Please increase: - NPH 20 units in am and 25 units at night  Start Ozempic at 0.25 mg weekly x 2-4 weeks, then increase to 0.5 mg weekly.  In the same time, decrease am NPH back to 15 units 2x a day.  Please return in 3-4 months with your sugar log.

## 2017-12-20 ENCOUNTER — Ambulatory Visit (INDEPENDENT_AMBULATORY_CARE_PROVIDER_SITE_OTHER): Payer: No Typology Code available for payment source | Admitting: Internal Medicine

## 2017-12-20 ENCOUNTER — Encounter: Payer: Self-pay | Admitting: Internal Medicine

## 2017-12-20 VITALS — BP 130/82 | HR 75 | Ht 66.0 in | Wt 216.0 lb

## 2017-12-20 DIAGNOSIS — E669 Obesity, unspecified: Secondary | ICD-10-CM | POA: Diagnosis not present

## 2017-12-20 DIAGNOSIS — E1165 Type 2 diabetes mellitus with hyperglycemia: Secondary | ICD-10-CM

## 2017-12-20 DIAGNOSIS — E785 Hyperlipidemia, unspecified: Secondary | ICD-10-CM

## 2017-12-20 DIAGNOSIS — Z794 Long term (current) use of insulin: Secondary | ICD-10-CM

## 2017-12-20 LAB — POCT GLYCOSYLATED HEMOGLOBIN (HGB A1C): Hemoglobin A1C: 8.3 % — AB (ref 4.0–5.6)

## 2017-12-20 MED ORDER — DAPAGLIFLOZIN PROPANEDIOL 10 MG PO TABS: 10.0000 mg | ORAL_TABLET | Freq: Every day | ORAL | 11 refills | Status: DC

## 2017-12-20 NOTE — Progress Notes (Signed)
Patient ID: Makaya Juneau, female   DOB: Apr 10, 1955, 62 y.o.   MRN: [id number]   HPI: Cruzita Lipa is a 62 y.o.-year-old female, returning for follow-up for DM2, dx as GDM - 1995, then as DM2 in 2010, insulin-dependent since 05/2016, uncontrolled, without long-term complications. Last visit 4 months ago.  She will start going to the gym (kick boxing) in [place]. She is also doing weight training. She goes there 3x a week for 45 min, at 6:45 pm. They also have a diet class.   She is limiting carbs.  Her son is getting married in 06/2017.  Last hemoglobin A1c was: Lab Results  Component Value Date   HGBA1C 7.9 (A) 08/16/2017   HGBA1C 8.1 05/16/2017   HGBA1C 6.7 02/13/2017  07/14/2016: HbA1c 10.1% 04/14/2016: HbA1c 11.3% 01/13/2016: HbA1c 10.4% 09/30/2015: HbA1c 10.9%  She is on: - NPH 20 units in am and 20 units at night   not affordable We tried Jardiance 25 mg >> $. She tried metformin R and ER, but it caused GI upset (N/D) She ran out of Glipizide ER 10 mg.   She was on Lantus 30 units at bedtime in the past but could not afford it anymore   Pt checks her sugars twice a day -sugars are worse: - am: 144-160 >> 104, 120-160 >> 140-200 - 2h after b'fast: n/c - before lunch:  107-129 >> n/c - 2h after lunch: n/c - before dinner: 120-127 >> n/c - 2h after dinner: n/c - bedtime: 260s >> 150-180 >> 180-200 - nighttime: n/c Lowest sugar was 128 >> 104 >> 114; she has hypoglycemia awareness at 100. Highest sugar was 300s >> 298 >> 200.  Glucometer: [employer]  Pt's meals are: - Breakfast: protein shake, cheese, sliced meat, toast - break: yoghurt - Lunch: sandwich or hamburger - break: banana - Dinner: chicken/steak + salad + starch - Dessert: cookies She does not like vegetables, fruits >> trying to work them into her diet. Batch cooking on Sundays.   -No CKD, last BUN/creatinine:  10/04/2016: BUN/Cr 10/0.76, Glu 277 07/14/2016: BUN/Cr 10/0.67, Glu  213 01/13/2016: UACR <0.7 No results found for: BUN, CREATININE  On lisinopril.  -+ HL; last set of lipids: 10/04/2016: 178/90/42/117 09/30/2015: 207/110/49/136 No results found for: CHOL, HDL, LDLCALC, LDLDIRECT, TRIG, CHOLHDL  On pravastatin.  - last eye exam was in 08/2017: No DR.  -No numbness and tingling in her feet.  She had a callus; sees podiatry.  ROS: Constitutional: no weight gain/no weight loss, no fatigue, no subjective hyperthermia, no subjective hypothermia Eyes: no blurry vision, no xerophthalmia ENT: + sore throat, no nodules palpated in throat, no dysphagia, no odynophagia, no hoarseness Cardiovascular: no CP/no SOB/no palpitations/+ leg swelling Respiratory: + cough/no SOB/no wheezing Gastrointestinal: no N/no V/+ D/no C/no acid reflux Musculoskeletal: + muscle aches/no joint aches Skin: + rash - poison ivy, no hair loss Neurological: no tremors/no numbness/no tingling/no dizziness, + HA  I reviewed pt's medications, allergies, PMH, social hx, family hx, and changes were documented in the history of present illness. Otherwise, unchanged from my initial visit note. PMH: - DM2 - HTN - HL - Depression  No past surgical history on file.   Social History   Social History  . Marital status: single    Spouse name: N/A  . Number of children: 5   Occupational History  . Insurance documentation specialist   Social History Main Topics  . Smoking status: No  . Smokeless tobacco: No  . Alcohol use Wine,  2x weekly  . Drug use: No   Current Outpatient Medications  Medication Sig Dispense Refill  . insulin NPH Human (HUMULIN N,NOVOLIN N) 100 UNIT/ML injection Inject 35 Units into the skin.    Marland Kitchen lisinopril (PRINIVIL,ZESTRIL) 10 MG tablet Take 10 mg by mouth daily.    . pravastatin (PRAVACHOL) 10 MG tablet Take 20 mg by mouth daily.     . Semaglutide (OZEMPIC) 0.25 or 0.5 MG/DOSE SOPN Inject 0.5 mg into the skin once a week. (Patient not taking: Reported on  08/16/2017) 2 pen 5   No current facility-administered medications for this visit.    No Known Allergies   Family History  Problem Relation Age of Onset  . Diabetes Mother   . Hypertension Mother   . Hyperlipidemia Mother   . Heart disease Mother   . Hypertension Father   . Hyperlipidemia Father   . Heart disease Father   . Cancer Father   . Cancer Sister    Pt has FH of DM in mother.  PE: BP 130/82   Pulse 75   Ht 5\' 6"  (1.676 m)   Wt 216 lb (98 kg)   SpO2 96%   BMI 34.86 kg/m  Wt Readings from Last 3 Encounters:  12/20/17 216 lb (98 kg)  08/16/17 217 lb (98.4 kg)  05/16/17 216 lb 12.8 oz (98.3 kg)   Constitutional: overweight, in NAD Eyes: PERRLA, EOMI, no exophthalmos ENT: moist mucous membranes, no thyromegaly, no cervical lymphadenopathy Cardiovascular: RRR, No RG, +1/6 SEM Respiratory: CTA B Gastrointestinal: abdomen soft, NT, ND, BS+ Musculoskeletal: no deformities, strength intact in all 4 Skin: moist, warm, no rashes Neurological: no tremor with outstretched hands, DTR normal in all 4  ASSESSMENT: 1. DM2, insulin-dependent, uncontrolled, without long te obesity rm complications, but with hyperglycemia  2. HL  3.  Obesity  PLAN:  1. Patient with long-standing, uncontrolled, type 2 diabetes, with much better control after starting GLP-1 receptor agonist, however, at last visit, she ran out of it so sugars were higher.  At last visit, I advised her to Ozempic and reduce her NPH doses.  She now has [employer]   -At this visit, she tells me that she could not start Ozempic due to price but she did reduce the doses of her NPH.  Her sugars are worse now, despite starting consistent exercise recently.  I strongly advised her to continue with exercise, but her sugars are higher in the morning and also at bedtime so we will go ahead and increase her NPH in the evening and add farxiga in the morning.  We discussed about the fact that she needs  to stay well-hydrated while on this medication.  She has an appointment coming up with PCP on 01/04/2018 and she will have annual labs then.  We will try to obtain these records to check her potassium and kidney function after we start the SGLT2 inhibitor. - I suggested to:  Patient Instructions  Please increase: - NPH 20 units in am and 25 units at bedtime  Please add: - Farxiga 10 mg before b'fast  Please return in 3 months with your sugar log.   - today, HbA1c is 8.3% (higher) - continue checking sugars at different times of the day - check 2x a day, rotating checks - advised for yearly eye exams >> she is UTD - refuses flu shot - Return to clinic in 3-4 mo with sugar log   2. HL  - Reviewed latest  lipid panel from 09/2016: All fractions were improved, however, LDL was still above goal - Continues pravastatin 20 without side effects. -We will have another lipid panel in 2 weeks at her [facility]  3.  Obesity -She did not lose weight since last visit -We will start Faxiga which should also help with weight loss.  Carlus Pavlov, MD PhD [facility] End[facility]

## 2017-12-20 NOTE — Patient Instructions (Addendum)
Please increase: - NPH 20 units in am and 25 units at bedtime  Please add: - Farxiga 10 mg before b'fast  Please return in 3 months with your sugar log.

## 2017-12-20 NOTE — Addendum Note (Signed)
Addended by: Darliss Ridgel I on: 12/20/2017 10:57 AM   Modules accepted: Orders

## 2018-01-11 ENCOUNTER — Other Ambulatory Visit: Payer: Self-pay | Admitting: Family Medicine

## 2018-01-11 DIAGNOSIS — Z1231 Encounter for screening mammogram for malignant neoplasm of breast: Secondary | ICD-10-CM

## 2018-02-22 ENCOUNTER — Ambulatory Visit
Admission: RE | Admit: 2018-02-22 | Discharge: 2018-02-22 | Disposition: A | Discharge status: Home / Self Care | Payer: No Typology Code available for payment source | Source: Ambulatory Visit | Attending: Family Medicine | Admitting: Family Medicine

## 2018-02-22 DIAGNOSIS — Z1231 Encounter for screening mammogram for malignant neoplasm of breast: Secondary | ICD-10-CM

## 2018-02-23 ENCOUNTER — Other Ambulatory Visit: Payer: Self-pay | Admitting: Family Medicine

## 2018-02-23 DIAGNOSIS — R928 Other abnormal and inconclusive findings on diagnostic imaging of breast: Secondary | ICD-10-CM

## 2018-02-28 ENCOUNTER — Ambulatory Visit
Admission: RE | Admit: 2018-02-28 | Discharge: 2018-02-28 | Disposition: A | Discharge status: Home / Self Care | Payer: No Typology Code available for payment source | Source: Ambulatory Visit | Attending: Family Medicine | Admitting: Family Medicine

## 2018-02-28 ENCOUNTER — Other Ambulatory Visit: Payer: Self-pay | Admitting: Family Medicine

## 2018-02-28 DIAGNOSIS — R928 Other abnormal and inconclusive findings on diagnostic imaging of breast: Secondary | ICD-10-CM

## 2018-02-28 DIAGNOSIS — N6320 Unspecified lump in the left breast, unspecified quadrant: Secondary | ICD-10-CM

## 2018-04-05 ENCOUNTER — Ambulatory Visit: Payer: No Typology Code available for payment source | Admitting: Internal Medicine

## 2018-07-09 ENCOUNTER — Ambulatory Visit (INDEPENDENT_AMBULATORY_CARE_PROVIDER_SITE_OTHER): Payer: No Typology Code available for payment source | Admitting: Internal Medicine

## 2018-07-09 ENCOUNTER — Encounter: Payer: Self-pay | Admitting: Internal Medicine

## 2018-07-09 DIAGNOSIS — E1165 Type 2 diabetes mellitus with hyperglycemia: Secondary | ICD-10-CM | POA: Diagnosis not present

## 2018-07-09 DIAGNOSIS — Z794 Long term (current) use of insulin: Secondary | ICD-10-CM | POA: Diagnosis not present

## 2018-07-09 DIAGNOSIS — E669 Obesity, unspecified: Secondary | ICD-10-CM | POA: Diagnosis not present

## 2018-07-09 DIAGNOSIS — E785 Hyperlipidemia, unspecified: Secondary | ICD-10-CM | POA: Diagnosis not present

## 2018-07-09 MED ORDER — GLIPIZIDE 5 MG PO TABS: 5.0000 mg | ORAL_TABLET | Freq: Every day | ORAL | 3 refills | Status: DC

## 2018-07-09 NOTE — Patient Instructions (Addendum)
Please continue: - Jardiance 25 mg before b'fast - NPH 20 units in am and 25 units at bedtime  Please add: - Glipizide 5 mg before a larger dinner  Please return in 3 months with your sugar log.

## 2018-07-09 NOTE — Progress Notes (Signed)
Patient ID: Benay SpiceJeanne Schipani, female   DOB: [birth year], 63 y.o.   MRN: [id number]   Patient location: Home My location: Office  Referring Provider: Farris HasMorrow, Aaron, MD  I connected with the patient on 07/09/18 at  3:30 PM EDT by a video enabled telemedicine application and verified that I am speaking with the correct person.   I discussed the limitations of evaluation and management by telemedicine and the availability of in person appointments. The patient expressed understanding and agreed to proceed.   Details of the encounter are shown below.  HPI: Benay SpiceJeanne Bertino is a 63 y.o.-year-old female, presenting for follow-up for DM2, dx as GDM - 1995, then as DM2 in 2010, insulin-dependent since 05/2016, uncontrolled, without long-term complications. Last visit 6 months ago.  At last visit, she started to go [employer] [place].  She was also doing weight training. However, she stopped doing the coronavirus pandemic.  She continues to limit carbs.  Last hemoglobin A1c was: Lab Results  Component Value Date   HGBA1C 8.3 (A) 12/20/2017   HGBA1C 7.9 (A) 08/16/2017   HGBA1C 8.1 05/16/2017  07/14/2016: HbA1c 10.1% 04/14/2016: HbA1c 11.3% 01/13/2016: HbA1c 10.4% 09/30/2015: HbA1c 10.9%  She is on: - NPH 20 units in am and 25 units at night  - Farxiga 10 mg before breakfast >> Jardiance 25 mg daily - better Ozempic was not affordable, unfortunately. We tried Jardiance 25 mg >> $. She tried metformin R and ER, but it caused GI upset (N/D) She ran out of Glipizide ER 10 mg.   She was on Lantus 30 units at bedtime in the past but could not afford it anymore  Pt checks her sugars twice a day: - am: 144-160 >> 104, 120-160 >> 140-200 >> 100-150 - 2h after b'fast: n/c - before lunch:  107-129 >> n/c - 2h after lunch: n/c - before dinner: 120-127 >> n/c >> 125-130 - 2h after dinner: n/c >> 148-240 (last 2 weeks: 169-185) - bedtime: 260s >> 150-180 >> 180-200 >> see above - nighttime:  n/c Lowest sugar was 114 >> 100; she has hypoglycemia awareness at 100. Highest sugar was 200 >> 240.  Glucometer: [employer] Cont[employer]  Pt's meals are: - Breakfast: protein shake, cheese, sliced meat, toast - break: yoghurt - Lunch: sandwich or hamburger - break: banana - Dinner: chicken/steak + salad + starch - Dessert: cookies She does not like vegetables and fruits but is trying to work them into her diet. Batch cooking on Sundays.   -No CKD, last BUN/creatinine:  10/04/2016: BUN/Cr 10/0.76, Glu 277 07/14/2016: BUN/Cr 10/0.67, Glu 213 01/13/2016: UACR <0.7 No results found for: BUN, CREATININE  On lisinopril.  -+ HL; last set of lipids: 10/04/2016: 178/90/42/117 09/30/2015: 207/110/49/136 No results found for: CHOL, HDL, LDLCALC, LDLDIRECT, TRIG, CHOLHDL  On pravastatin.  - last eye exam was in 08/2017: No DR  - no numbness and tingling in her feet.  She had a callus; sees podiatry.  She had a recent mammogram >> a mass found >> will return in 6 months.  ROS: Constitutional: no weight gain/no weight loss, no fatigue, no subjective hyperthermia, no subjective hypothermia Eyes: no blurry vision, no xerophthalmia ENT: no sore throat, no nodules palpated in neck, no dysphagia, no odynophagia, no hoarseness Cardiovascular: no CP/no SOB/no palpitations/no leg swelling Respiratory: no cough/no SOB/no wheezing Gastrointestinal: no N/no V/no D/no C/no acid reflux Musculoskeletal: no muscle aches/no joint aches Skin: no rashes, no hair loss Neurological: no tremors/no numbness/no tingling/no dizziness  I reviewed pt's medications, allergies,  PMH, social hx, family hx, and changes were documented in the history of present illness. Otherwise, unchanged from my initial visit note.  PMH: - DM2 - HTN - HL - Depression  No past surgical history on file.   Social History   Social History  . Marital status: single    Spouse name: N/A  . Number of children: 5   Occupational  History  . Insurance documentation specialist   Social History Main Topics  . Smoking status: No  . Smokeless tobacco: No  . Alcohol use Wine, 2x weekly  . Drug use: No   Current Outpatient Medications  Medication Sig Dispense Refill  . dapagliflozin propanediol (FARXIGA) 10 MG TABS tablet Take 10 mg by mouth daily. 30 tablet 11  . insulin NPH Human (HUMULIN N,NOVOLIN N) 100 UNIT/ML injection Inject 45 Units into the skin daily.    Marland Kitchen lisinopril (PRINIVIL,ZESTRIL) 10 MG tablet Take 10 mg by mouth daily.    . pravastatin (PRAVACHOL) 10 MG tablet Take 20 mg by mouth daily.      No current facility-administered medications for this visit.    No Known Allergies   Family History  Problem Relation Age of Onset  . Diabetes Mother   . Hypertension Mother   . Hyperlipidemia Mother   . Heart disease Mother   . Hypertension Father   . Hyperlipidemia Father   . Heart disease Father   . Cancer Father   . Cancer Sister    Pt has FH of DM in mother.  PE: There were no vitals taken for this visit. Wt Readings from Last 3 Encounters:  12/20/17 216 lb (98 kg)  08/16/17 217 lb (98.4 kg)  05/16/17 216 lb 12.8 oz (98.3 kg)   Constitutional:  in NAD  The physical exam was not performed (virtual visit).  ASSESSMENT: 1. DM2, insulin-dependent, uncontrolled, without long te obesity rm complications, but with hyperglycemia  2. HL  3.  Obesity  PLAN:  1. Patient with longstanding, uncontrolled, type 2 diabetes, with much better control after starting a GLP-1 receptor agonist, however, this was not affordable for her anymore after switching to [employer]  At last visit, sugars were worse despite starting consistent exercise.  At that time we increased her NPH doses and added [place].  Her sugars improved on Farxiga, but now she is on Jardiance after she obtained the medication from her sister.  She feels that her sugars are better on this compared to [place].  Reviewing  her logs at home, her sugars are at goal in the morning but they are still high at night, approximately 2 hours after dinner.  She mentions that in the last 2 to 3 weeks they have improved after dinner, but they are still slightly above target. -We discussed about ideally improving her dinner, however, for now, we can also use glipizide 5 mg before a larger meal.  I advised her to cut the tablets in half and take 2.5 mg only if sugars remain high after regular meals.  We will continue Jardiance and NPH for now. - I suggested to:  Patient Instructions  Please continue: - Jardiance 25 mg before b'fast - NPH 20 units in am and 25 units at bedtime  Please add: - Glipizide 5 mg before a larger dinner  Please return in 3 months with your sugar log.   -We will check her HbA1c when she returns to the clinic - continue checking sugars at different times of the  day - check 2x a day, rotating checks - advised for yearly eye exams >> she is UTD - Return to clinic in 3 mo with sugar log   2. HL  - Reviewed latest lipid panel from 09/2016: All fractions were improved, however, LDL was still above goal - Continues pravastatin 20 without side effects. - She is due for another lipid panel  3.  Obesity -At last visit, we started [place] which should also help with weight loss -stable weight -she is a little bit disappointed by this, but I encouraged her to continue exercise  Carlus Pavlov, MD PhD [facility] Endocrin[facility]

## 2018-08-31 ENCOUNTER — Ambulatory Visit
Admission: RE | Admit: 2018-08-31 | Discharge: 2018-08-31 | Disposition: A | Discharge status: Home / Self Care | Payer: No Typology Code available for payment source | Source: Ambulatory Visit | Attending: Family Medicine | Admitting: Family Medicine

## 2018-08-31 ENCOUNTER — Other Ambulatory Visit: Payer: Self-pay | Admitting: Family Medicine

## 2018-08-31 ENCOUNTER — Other Ambulatory Visit: Payer: Self-pay

## 2018-08-31 DIAGNOSIS — N6320 Unspecified lump in the left breast, unspecified quadrant: Secondary | ICD-10-CM

## 2019-01-23 ENCOUNTER — Other Ambulatory Visit: Payer: Self-pay | Admitting: Family Medicine

## 2019-01-23 ENCOUNTER — Other Ambulatory Visit (HOSPITAL_COMMUNITY)
Admission: RE | Admit: 2019-01-23 | Discharge: 2019-01-23 | Disposition: A | Discharge status: Home / Self Care | Payer: BC Managed Care – PPO | Source: Ambulatory Visit | Attending: Family Medicine | Admitting: Family Medicine

## 2019-01-23 DIAGNOSIS — E785 Hyperlipidemia, unspecified: Secondary | ICD-10-CM | POA: Diagnosis not present

## 2019-01-23 DIAGNOSIS — Z124 Encounter for screening for malignant neoplasm of cervix: Secondary | ICD-10-CM | POA: Diagnosis not present

## 2019-01-23 DIAGNOSIS — E119 Type 2 diabetes mellitus without complications: Secondary | ICD-10-CM | POA: Diagnosis not present

## 2019-01-23 DIAGNOSIS — Z23 Encounter for immunization: Secondary | ICD-10-CM | POA: Diagnosis not present

## 2019-01-23 DIAGNOSIS — Z6836 Body mass index (BMI) 36.0-36.9, adult: Secondary | ICD-10-CM | POA: Diagnosis not present

## 2019-01-23 DIAGNOSIS — Z0000 Encounter for general adult medical examination without abnormal findings: Secondary | ICD-10-CM | POA: Diagnosis not present

## 2019-01-23 DIAGNOSIS — N760 Acute vaginitis: Secondary | ICD-10-CM | POA: Diagnosis not present

## 2019-01-23 LAB — HEMOGLOBIN A1C: Hemoglobin A1C: 8.4

## 2019-01-25 LAB — CYTOLOGY - PAP
Adequacy: ABSENT
Comment: NEGATIVE
Diagnosis: NEGATIVE
High risk HPV: NEGATIVE

## 2019-01-30 ENCOUNTER — Encounter: Payer: Self-pay | Admitting: Internal Medicine

## 2019-03-04 ENCOUNTER — Telehealth: Payer: Self-pay

## 2019-03-04 NOTE — Telephone Encounter (Signed)
Made in error

## 2019-03-05 DIAGNOSIS — Z01818 Encounter for other preprocedural examination: Secondary | ICD-10-CM | POA: Diagnosis not present

## 2019-03-12 ENCOUNTER — Other Ambulatory Visit: Payer: Self-pay | Admitting: Family Medicine

## 2019-03-12 ENCOUNTER — Ambulatory Visit
Admission: RE | Admit: 2019-03-12 | Discharge: 2019-03-12 | Disposition: A | Discharge status: Home / Self Care | Payer: BC Managed Care – PPO | Source: Ambulatory Visit | Attending: Family Medicine | Admitting: Family Medicine

## 2019-03-12 ENCOUNTER — Other Ambulatory Visit: Payer: Self-pay

## 2019-03-12 ENCOUNTER — Ambulatory Visit
Admission: RE | Admit: 2019-03-12 | Discharge: 2019-03-12 | Disposition: A | Discharge status: Home / Self Care | Payer: No Typology Code available for payment source | Source: Ambulatory Visit | Attending: Family Medicine | Admitting: Family Medicine

## 2019-03-12 DIAGNOSIS — N6320 Unspecified lump in the left breast, unspecified quadrant: Secondary | ICD-10-CM

## 2019-03-12 DIAGNOSIS — N6321 Unspecified lump in the left breast, upper outer quadrant: Secondary | ICD-10-CM | POA: Diagnosis not present

## 2019-03-12 DIAGNOSIS — R922 Inconclusive mammogram: Secondary | ICD-10-CM | POA: Diagnosis not present

## 2019-03-12 DIAGNOSIS — N6310 Unspecified lump in the right breast, unspecified quadrant: Secondary | ICD-10-CM

## 2019-03-12 DIAGNOSIS — N6311 Unspecified lump in the right breast, upper outer quadrant: Secondary | ICD-10-CM | POA: Diagnosis not present

## 2019-04-05 ENCOUNTER — Ambulatory Visit (INDEPENDENT_AMBULATORY_CARE_PROVIDER_SITE_OTHER): Payer: BC Managed Care – PPO | Admitting: Internal Medicine

## 2019-04-05 ENCOUNTER — Encounter: Payer: Self-pay | Admitting: Internal Medicine

## 2019-04-05 ENCOUNTER — Other Ambulatory Visit: Payer: Self-pay

## 2019-04-05 DIAGNOSIS — E669 Obesity, unspecified: Secondary | ICD-10-CM

## 2019-04-05 DIAGNOSIS — E785 Hyperlipidemia, unspecified: Secondary | ICD-10-CM

## 2019-04-05 DIAGNOSIS — E1165 Type 2 diabetes mellitus with hyperglycemia: Secondary | ICD-10-CM

## 2019-04-05 DIAGNOSIS — Z794 Long term (current) use of insulin: Secondary | ICD-10-CM

## 2019-04-05 MED ORDER — TRESIBA FLEXTOUCH 200 UNIT/ML ~~LOC~~ SOPN: 50.0000 [IU] | PEN_INJECTOR | Freq: Every day | SUBCUTANEOUS | 3 refills | Status: DC

## 2019-04-05 MED ORDER — FARXIGA 10 MG PO TABS: 10.0000 mg | ORAL_TABLET | Freq: Every day | ORAL | 3 refills | Status: DC

## 2019-04-05 MED ORDER — INSULIN PEN NEEDLE 32G X 4 MM MISC: 3 refills | Status: AC

## 2019-04-05 NOTE — Progress Notes (Signed)
Patient ID: Destiny Munoz, female   DOB: [birth year], 64 y.o.   MRN: [id number]   Patient location: Home My location: Office Persons participating in the virtual visit: patient, provider  Referring Provider: Farris Has, MD  I connected with the patient on 04/05/19 at  8:33 am EST by a video enabled telemedicine application and verified that I am speaking with the correct person.   I discussed the limitations of evaluation and management by telemedicine and the availability of in person appointments. The patient expressed understanding and agreed to proceed.   Details of the encounter are shown below.   HPI: Destiny Munoz is a 64 y.o.-year-old female, presenting for follow-up for DM2, dx as GDM - 1995, then as DM2 in 2010, insulin-dependent since 05/2016, uncontrolled, without long-term complications. Last visit 9 months ago (virtual). She now has a Stage manager.  Reviewed HbA1c levels: Lab Results  Component Value Date   HGBA1C 8.4 01/23/2019   HGBA1C 8.3 (A) 12/20/2017   HGBA1C 7.9 (A) 08/16/2017  07/14/2016: HbA1c 10.1% 04/14/2016: HbA1c 11.3% 01/13/2016: HbA1c 10.4% 09/30/2015: HbA1c 10.9%  She is on: - NPH 20 >> 30 units in a.m. and 25 >> 30 units at night - Glipizide 5 mg before larger dinner-added 06/2018  - Farxiga 10 mg before breakfast >> Jardiance 25 mg daily - ran out 01/2019 Ozempic was not affordable, unfortunately. We tried Jardiance 25 mg >> $. She tried metformin R and ER, but it caused GI upset (N/D) She ran out of Glipizide ER 10 mg.   She was on Lantus 30 units at bedtime in the past but could not afford it anymore  Pt checks her sugars twice a day: - am: 104, 120-160 >> 140-200 >> 100-150 >> 160-200 - 2h after b'fast: n/c - before lunch:  107-129 >> n/c - 2h after lunch: n/c - before dinner: 120-127 >> n/c >> 125-130 >> n/c - 2h after dinner: 148-240 (last 2 weeks: 169-185)  >> n/c - bedtime: 260s >> 150-180 >> 180-200 >> see  above >> 160-200 - nighttime: n/c Lowest sugar was 114 >> 100 >> 116; she has hypoglycemia awareness at 100. Highest sugar was 200 >> 240 >> 300.  Glucometer: [employer]  Pt's meals are: - Breakfast: protein shake, cheese, sliced meat, toast - break: yoghurt - Lunch: sandwich or hamburger - break: banana - Dinner: chicken/steak + salad + starch - Dessert: cookies She does not like vegetables and fruits.  -No CKD, last BUN/creatinine:  10/04/2016: BUN/Cr 10/0.76, Glu 277 07/14/2016: BUN/Cr 10/0.67, Glu 213 01/13/2016: UACR <0.7 No results found for: BUN, CREATININE  On lisinopril  -+ HL; last set of lipids: 10/04/2016: 178/90/42/117 09/30/2015: 207/110/49/136 No results found for: CHOL, HDL, LDLCALC, LDLDIRECT, TRIG, CHOLHDL  On pravastatin 20 >> 40.  - last eye exam was in 08/2017: No DR. Coming up.  - no numbness and tingling in her feet.  She had a callus; sees podiatry.  She had a recent mammogram >> a mass found >> will return in 6 months.  ROS: Constitutional: no weight gain/no weight loss, no fatigue, no subjective hyperthermia, no subjective hypothermia Eyes: no blurry vision, no xerophthalmia ENT: no sore throat, no nodules palpated in neck, no dysphagia, no odynophagia, no hoarseness Cardiovascular: no CP/no SOB/no palpitations/no leg swelling Respiratory: no cough/no SOB/no wheezing Gastrointestinal: no N/no V/no D/no C/no acid reflux Musculoskeletal: no muscle aches/no joint aches Skin: no rashes, no hair loss Neurological: no tremors/no numbness/no tingling/no dizziness  I reviewed pt's medications,  allergies, PMH, social hx, family hx, and changes were documented in the history of present illness. Otherwise, unchanged from my initial visit note.  PMH: - DM2 - HTN - HL - Depression  No past surgical history on file.   Social History   Social History  . Marital status: single    Spouse name: N/A  . Number of children: 5   Occupational  History  . Insurance documentation specialist   Social History Main Topics  . Smoking status: No  . Smokeless tobacco: No  . Alcohol use Wine, 2x weekly  . Drug use: No   Current Outpatient Medications  Medication Sig Dispense Refill  . dapagliflozin propanediol (FARXIGA) 10 MG TABS tablet Take 10 mg by mouth daily. 30 tablet 11  . glipiZIDE (GLUCOTROL) 5 MG tablet Take 1 tablet (5 mg total) by mouth daily before supper. 90 tablet 3  . insulin NPH Human (HUMULIN N,NOVOLIN N) 100 UNIT/ML injection Inject 45 Units into the skin daily.    Marland Kitchen lisinopril (PRINIVIL,ZESTRIL) 10 MG tablet Take 10 mg by mouth daily.    . pravastatin (PRAVACHOL) 10 MG tablet Take 20 mg by mouth daily.      No current facility-administered medications for this visit.   No Known Allergies   Family History  Problem Relation Age of Onset  . Diabetes Mother   . Hypertension Mother   . Hyperlipidemia Mother   . Heart disease Mother   . Hypertension Father   . Hyperlipidemia Father   . Heart disease Father   . Cancer Father   . Cancer Sister    Pt has FH of DM in mother.  PE: There were no vitals taken for this visit. Wt Readings from Last 3 Encounters:  12/20/17 216 lb (98 kg)  08/16/17 217 lb (98.4 kg)  05/16/17 216 lb 12.8 oz (98.3 kg)   Constitutional:  in NAD  The physical exam was not performed (virtual visit).  ASSESSMENT: 1. DM2, insulin-dependent, uncontrolled, without long te obesity rm complications, but with hyperglycemia  2. HL  3.  Obesity class I  PLAN:  1. Patient with longstanding, uncontrolled, type 2 diabetes, previously with much better control after starting a GLP-1 receptor agonist, however, this was not covered anymore at the previous visits.  Also, we had to switch to NPH since analog insulins were not affordable.  Sugars initially improved on an SGLT2 inhibitor, which we added afterwards, but since last visit, she ran out and did not refill it in the last 2 months. -At  this visit, sugars are higher, in the morning, and above goal, also, at that time -She mentions that she has a better insurance now so analog insulins may be affordable.  Therefore, I advised her to stop NPH and start [place] daily.  We will use the U200 formulation which is better absorbed.  Also, will restart [place] and I advised him to get in touch with me in approximately 3 weeks to see if the sugars improved.  If they are not significantly improved, we can add a GLP-1 receptor agonist at that time. - I suggested to:  Patient Instructions  Please restart: - Farxiga 10 mg before b'fast  Continue: - Glipizide 5 mg before a larger dinner  Stop NPH and start: - Tresiba 50 units daily  Please get in touch with me about the blood sugars in ~3 weeks.  Please return in 3 months with your sugar log   - we will check her HbA1c when she  returns to the clinic - advised to check sugars at different times of the day - 2x a day, rotating check times - advised for yearly eye exams >> she is not UTD, but has an appointment scheduled - return to clinic in 3-4 months   2. HL  - Reviewed latest lipid panel from 09/2017, all fractions improved, however, LDL was still above goal - Continues pravastatin (now at increased dose) without side effects.   3.  Obesity class I -Weight stable since last visit -She was off the SGLT2 inhibitor but we will restart this, which should also help with weight loss -If sugars do not improve, I plan to start a GLP-1 receptor agonist in 3 weeks.  This should also help with weight loss  Carlus Pavlov, MD PhD [facility] Endocrinology

## 2019-04-05 NOTE — Patient Instructions (Addendum)
Please restart: - Farxiga 10 mg before b'fast  Continue: - Glipizide 5 mg before a larger dinner  Stop NPH and start: - Tresiba 50 units daily  Please get in touch with me about the blood sugars in ~3 weeks.  Please return in 3 months with your sugar log

## 2019-04-10 DIAGNOSIS — Z1159 Encounter for screening for other viral diseases: Secondary | ICD-10-CM | POA: Diagnosis not present

## 2019-04-15 DIAGNOSIS — K649 Unspecified hemorrhoids: Secondary | ICD-10-CM | POA: Diagnosis not present

## 2019-04-15 DIAGNOSIS — Z1211 Encounter for screening for malignant neoplasm of colon: Secondary | ICD-10-CM | POA: Diagnosis not present

## 2019-04-15 DIAGNOSIS — K5730 Diverticulosis of large intestine without perforation or abscess without bleeding: Secondary | ICD-10-CM | POA: Diagnosis not present

## 2019-07-05 DIAGNOSIS — N3001 Acute cystitis with hematuria: Secondary | ICD-10-CM | POA: Diagnosis not present

## 2019-07-22 ENCOUNTER — Other Ambulatory Visit: Payer: Self-pay | Admitting: Internal Medicine

## 2019-07-23 DIAGNOSIS — E785 Hyperlipidemia, unspecified: Secondary | ICD-10-CM | POA: Diagnosis not present

## 2019-07-23 DIAGNOSIS — I10 Essential (primary) hypertension: Secondary | ICD-10-CM | POA: Diagnosis not present

## 2019-07-23 DIAGNOSIS — E1169 Type 2 diabetes mellitus with other specified complication: Secondary | ICD-10-CM | POA: Diagnosis not present

## 2019-07-23 LAB — HEMOGLOBIN A1C: Hemoglobin A1C: 7.8

## 2019-07-29 ENCOUNTER — Encounter: Payer: Self-pay | Admitting: Internal Medicine

## 2019-10-25 ENCOUNTER — Other Ambulatory Visit: Payer: Self-pay

## 2019-10-25 ENCOUNTER — Telehealth: Payer: Self-pay | Admitting: Internal Medicine

## 2019-10-25 DIAGNOSIS — E1165 Type 2 diabetes mellitus with hyperglycemia: Secondary | ICD-10-CM

## 2019-10-25 MED ORDER — TRESIBA FLEXTOUCH 200 UNIT/ML ~~LOC~~ SOPN: 50.0000 [IU] | PEN_INJECTOR | Freq: Every day | SUBCUTANEOUS | 0 refills | Status: DC

## 2019-10-25 NOTE — Telephone Encounter (Signed)
Outpatient Medication Detail   Disp Refills Start End   insulin degludec (TRESIBA FLEXTOUCH) 200 UNIT/ML FlexTouch Pen 15 mL 0 10/25/2019    Sig - Route: Inject 50 Units into the skin daily. OVERDUE FOR APPT. WILL PROVIDE 30 DAY SUPPLY - Subcutaneous   Sent to pharmacy as: insulin degludec (TRESIBA FLEXTOUCH) 200 UNIT/ML FlexTouch Pen   E-Prescribing Status: Receipt confirmed by pharmacy (10/25/2019  3:16 PM EDT)

## 2019-10-25 NOTE — Telephone Encounter (Signed)
Patient scheduled for a follow up on 12/13/19 - states she only has one pen left and requesting a refill as soon as possible for [place].

## 2019-12-13 ENCOUNTER — Ambulatory Visit: Payer: BC Managed Care – PPO | Admitting: Internal Medicine

## 2019-12-13 ENCOUNTER — Other Ambulatory Visit: Payer: Self-pay

## 2019-12-13 ENCOUNTER — Encounter: Payer: Self-pay | Admitting: Internal Medicine

## 2019-12-13 VITALS — BP 148/90 | HR 90 | Ht 66.0 in | Wt 211.2 lb

## 2019-12-13 DIAGNOSIS — E1165 Type 2 diabetes mellitus with hyperglycemia: Secondary | ICD-10-CM | POA: Diagnosis not present

## 2019-12-13 DIAGNOSIS — Z794 Long term (current) use of insulin: Secondary | ICD-10-CM | POA: Diagnosis not present

## 2019-12-13 DIAGNOSIS — E669 Obesity, unspecified: Secondary | ICD-10-CM | POA: Diagnosis not present

## 2019-12-13 DIAGNOSIS — E785 Hyperlipidemia, unspecified: Secondary | ICD-10-CM

## 2019-12-13 LAB — POCT GLYCOSYLATED HEMOGLOBIN (HGB A1C): Hemoglobin A1C: 7.4 % — AB (ref 4.0–5.6)

## 2019-12-13 MED ORDER — DAPAGLIFLOZIN PROPANEDIOL 10 MG PO TABS: 10.0000 mg | ORAL_TABLET | Freq: Every day | ORAL | 3 refills | Status: DC

## 2019-12-13 MED ORDER — TRESIBA FLEXTOUCH 200 UNIT/ML ~~LOC~~ SOPN: 50.0000 [IU] | PEN_INJECTOR | Freq: Every day | SUBCUTANEOUS | 3 refills | Status: DC

## 2019-12-13 MED ORDER — GLIPIZIDE 5 MG PO TABS: 5.0000 mg | ORAL_TABLET | Freq: Every day | ORAL | 3 refills | Status: DC

## 2019-12-13 NOTE — Patient Instructions (Addendum)
Please continue: - Farxiga 10 mg before b'fast - Tresiba 50 units daily  Please start: - Glipizide 5 mg before a dinner  Please return in 3 months with your sugar log

## 2019-12-13 NOTE — Addendum Note (Signed)
Addended by: Darliss Ridgel I on: 12/13/2019 01:15 PM   Modules accepted: Orders

## 2019-12-13 NOTE — Progress Notes (Signed)
Patient ID: Destiny Munoz, female   DOB: 1955-10-19, 64 y.o.   MRN: [id number]   This visit occurred during the SARS-CoV-2 public health emergency.  Safety protocols were in place, including screening questions prior to the visit, additional usage of staff PPE, and extensive cleaning of exam room while observing appropriate contact time as indicated for disinfecting solutions.   HPI: Destiny Munoz is a 64 y.o.-year-old female, presenting for follow-up for DM2, dx as GDM - 1995, then as DM2 in 2010, insulin-dependent since 05/2016, uncontrolled, without long-term complications. Last visit 8 months ago (virtual). She changed her insurance before last visit.  Reviewed HbA1c levels: Lab Results  Component Value Date   HGBA1C 7.8 07/23/2019   HGBA1C 8.4 01/23/2019   HGBA1C 8.3 (A) 12/20/2017  07/14/2016: HbA1c 10.1% 04/14/2016: HbA1c 11.3% 01/13/2016: HbA1c 10.4% 09/30/2015: HbA1c 10.9%  She is on: - NPH 20 >> 30 units in a.m. and 25 >> 30 units at night >> Tresiba 50 units daily - Glipizide 5 mg before larger dinner-added 06/2018 >> not using   - Farxiga 10 mg before breakfast >> Jardiance 25 mg daily - ran out 01/2019 >> restarted Farxiga 10 mg before breakfast 03/2019 Ozempic was not affordable, unfortunately. We tried Jardiance 25 mg >> $. She tried metformin R and ER, but it caused GI upset (N/D) She ran out of Glipizide ER 10 mg.   She was on Lantus 30 units at bedtime in the past but could not afford it anymore  Pt checks her sugars twice a day: - am: 104, 120-160 >> 140-200 >> 100-150 >> 160-200 >> 98, 130-140, 160 - 2h after b'fast: n/c - before lunch:  107-129 >> n/c - 2h after lunch: n/c - before dinner: 120-127 >> n/c >> 125-130 >> n/c - 2h after dinner: 148-240 (last 2 weeks: 169-185)  >> n/c - bedtime: 180-200 >> see above >> 160-200 >> up to 184 - nighttime: n/c Lowest sugar was 114 >> 100 >> 116 >> 98; she has hypoglycemia awareness at 100. Highest  sugar was 200 >> 240 >> 300 >> 184.  Glucometer: [employer]  Pt's meals are: - Breakfast: protein shake, cheese, sliced meat, toast - break: yoghurt - Lunch: sandwich or hamburger - break: banana - Dinner: chicken/steak + salad + starch - Dessert: cookies She does not like vegetables and fruits.  -No CKD, last BUN/creatinine: 07/23/2019: BUN/Cr 21/0.71 10/04/2016: BUN/Cr 10/0.76, Glu 277 07/14/2016: BUN/Cr 10/0.67, Glu 213 01/13/2016: UACR <0.7 No results found for: BUN, CREATININE  On lisinopril  -+ HL; last set of lipids: 07/23/2019: 169/104/53/97 10/04/2016: 178/90/42/117 09/30/2015: 207/110/49/136 No results found for: CHOL, HDL, LDLCALC, LDLDIRECT, TRIG, CHOLHDL  On pravastatin 20 >> now 40.  - last eye exam was in 04/2019: No DR reportedly  - nonumbness and tingling in her feet.  She has a callus.  Sees podiatry.  She had a recent mammogram >> a mass found >> will return in 6 months.  ROS: Constitutional: no weight gain/no weight loss, no fatigue, no subjective hyperthermia, no subjective hypothermia Eyes: no blurry vision, no xerophthalmia ENT: no sore throat, no nodules palpated in neck, no dysphagia, no odynophagia, no hoarseness Cardiovascular: no CP/no SOB/no palpitations/no leg swelling Respiratory: no cough/no SOB/no wheezing Gastrointestinal: no N/no V/no D/no C/no acid reflux Musculoskeletal: no muscle aches/no joint aches Skin: no rashes, no hair loss Neurological: no tremors/no numbness/no tingling/no dizziness  I reviewed pt's medications, allergies, PMH, social hx, family hx, and changes were documented in the history  of present illness. Otherwise, unchanged from my initial visit note.  PMH: - DM2 - HTN - HL - Depression  No past surgical history on file.   Social History   Social History  . Marital status: single    Spouse name: N/A  . Number of children: 5   Occupational History  . Insurance documentation specialist   Social History  Main Topics  . Smoking status: No  . Smokeless tobacco: No  . Alcohol use Wine, 2x weekly  . Drug use: No   Current Outpatient Medications  Medication Sig Dispense Refill  . dapagliflozin propanediol (FARXIGA) 10 MG TABS tablet Take 10 mg by mouth daily before breakfast. 90 tablet 3  . glipiZIDE (GLUCOTROL) 5 MG tablet Take 1 tablet (5 mg total) by mouth daily before supper. 90 tablet 3  . insulin degludec (TRESIBA FLEXTOUCH) 200 UNIT/ML FlexTouch Pen Inject 50 Units into the skin daily. OVERDUE FOR APPT. WILL PROVIDE 30 DAY SUPPLY 15 mL 0  . Insulin Pen Needle 32G X 4 MM MISC Use 1x a day 100 each 3  . lisinopril (PRINIVIL,ZESTRIL) 10 MG tablet Take 10 mg by mouth daily.    . pravastatin (PRAVACHOL) 10 MG tablet Take 20 mg by mouth daily.      No current facility-administered medications for this visit.   No Known Allergies   Family History  Problem Relation Age of Onset  . Diabetes Mother   . Hypertension Mother   . Hyperlipidemia Mother   . Heart disease Mother   . Hypertension Father   . Hyperlipidemia Father   . Heart disease Father   . Cancer Father   . Cancer Sister    Pt has FH of DM in mother.  PE: BP (!) 148/90 (BP Location: Left Arm, Patient Position: Sitting, Cuff Size: Normal)   Pulse 90   Ht 5\' 6"  (1.676 m)   Wt 211 lb 3.2 oz (95.8 kg)   SpO2 98%   BMI 34.09 kg/m  Wt Readings from Last 3 Encounters:  12/13/19 211 lb 3.2 oz (95.8 kg)  12/20/17 216 lb (98 kg)  08/16/17 217 lb (98.4 kg)   Constitutional: overweight, in NAD Eyes: PERRLA, EOMI, no exophthalmos ENT: moist mucous membranes, no thyromegaly, no cervical lymphadenopathy Cardiovascular: RRR, No MRG Respiratory: CTA B Gastrointestinal: abdomen soft, NT, ND, BS+ Musculoskeletal: no deformities, strength intact in all 4 Skin: moist, warm, no rashes Neurological: no tremor with outstretched hands, DTR normal in all 4  ASSESSMENT: 1. DM2, insulin-dependent, uncontrolled, without long term  complications, but with hyperglycemia  2. HL  3.  Obesity class I  PLAN:  1. Patient with longstanding, uncontrolled, type 2 diabetes, previously with much better control after starting GLP-1 receptor agonist, however, off the medication afterwards since this was not covered anymore.  Also, we had to switch to NPH since analog insulins were not affordable.  However, at last visit, she had a better insurance and we stopped NPH and advised her to start 08/18/17.  At that time, she ran out of the SGLT2 inhibitor and we added this back [place]).  HbA1c at last visit was 7.8%, still improved from 8.4%. - at this visit, sugars appear improved, especially in the morning, but they are still above target.  She is seldom checking at night, only when she feels poorly.  She did not see sugars higher than 190 in the last few months.  For now, we discussed about adding glipizide before every dinner to hopefully improve post  dinner and subsequently morning blood sugars.  We will also continue the same doses of [place].  She is tolerating [place] well, without side effects. - I suggested to:  Patient Instructions  Please continue: - Farxiga 10 mg before b'fast - Tresiba 50 units daily  Please start: - Glipizide 5 mg before a dinner  Please return in 3 months with your sugar log   - we checked her HbA1c: 7.4% (better) - advised to check sugars at different times of the day - 2x a day, rotating check times - advised for yearly eye exams >> she is UTD - return to clinic in 3-4 months   2. HL - Reviewed latest lipid panel from 07/2019: LDL at goal, as are the rest of the fractions. -Continues pravastatin (increased dose) without side effects  3.  Obesity class I -We restarted her SGLT2 inhibitor at last visit.  This should also help with weight loss - lost 5 lbs in last 2 years  Carlus Pavlov, MD PhD [facility]

## 2020-03-27 ENCOUNTER — Ambulatory Visit (INDEPENDENT_AMBULATORY_CARE_PROVIDER_SITE_OTHER): Payer: BC Managed Care – PPO | Admitting: Internal Medicine

## 2020-03-27 ENCOUNTER — Encounter: Payer: Self-pay | Admitting: Internal Medicine

## 2020-03-27 ENCOUNTER — Other Ambulatory Visit: Payer: Self-pay

## 2020-03-27 VITALS — BP 120/78 | HR 80 | Ht 66.0 in | Wt 215.7 lb

## 2020-03-27 DIAGNOSIS — Z794 Long term (current) use of insulin: Secondary | ICD-10-CM | POA: Diagnosis not present

## 2020-03-27 DIAGNOSIS — E785 Hyperlipidemia, unspecified: Secondary | ICD-10-CM

## 2020-03-27 DIAGNOSIS — Z23 Encounter for immunization: Secondary | ICD-10-CM

## 2020-03-27 DIAGNOSIS — E1165 Type 2 diabetes mellitus with hyperglycemia: Secondary | ICD-10-CM | POA: Diagnosis not present

## 2020-03-27 DIAGNOSIS — E669 Obesity, unspecified: Secondary | ICD-10-CM

## 2020-03-27 LAB — POCT GLYCOSYLATED HEMOGLOBIN (HGB A1C): Hemoglobin A1C: 7.7 % — AB (ref 4.0–5.6)

## 2020-03-27 MED ORDER — OZEMPIC (0.25 OR 0.5 MG/DOSE) 2 MG/1.5ML ~~LOC~~ SOPN: 0.5000 mg | PEN_INJECTOR | SUBCUTANEOUS | 3 refills | Status: DC

## 2020-03-27 NOTE — Addendum Note (Signed)
Addended by: Kenyon Ana on: 03/27/2020 11:08 AM   Modules accepted: Orders

## 2020-03-27 NOTE — Progress Notes (Signed)
Patient ID: Destiny Munoz, female   DOB: [birth year], 65 y.o.   MRN: [id number]   This visit occurred during the SARS-CoV-2 public health emergency.  Safety protocols were in place, including screening questions prior to the visit, additional usage of staff PPE, and extensive cleaning of exam room while observing appropriate contact time as indicated for disinfecting solutions.   HPI: Destiny Munoz is a 65 y.o.-year-old female, presenting for follow-up for DM2, dx as GDM - 1995, then as DM2 in 2010, insulin-dependent since 05/2016, uncontrolled, without long-term complications. Last visit 3.5 months ago. She changed her insurance before last visit.  Reviewed HbA1c levels: Lab Results  Component Value Date   HGBA1C 7.4 (A) 12/13/2019   HGBA1C 7.8 07/23/2019   HGBA1C 8.4 01/23/2019  07/14/2016: HbA1c 10.1% 04/14/2016: HbA1c 11.3% 01/13/2016: HbA1c 10.4% 09/30/2015: HbA1c 10.9%  She is on: - NPH 20 >> 30 units in a.m. and 25 >> 30 units at night >> Tresiba 50 units daily - Glipizide 5 mg before larger dinner-added 06/2018 >> restarted 11/2019 - after dinner!!!  - Farxiga 10 mg before breakfast >> Jardiance 25 mg daily - ran out 01/2019 >> Farxiga 10 mg before breakfast Ozempic was not affordable, unfortunately. We tried Jardiance 25 mg >> $. She tried metformin R and ER, but it caused GI upset (N/D) She ran out of Glipizide ER 10 mg.   She was on Lantus 30 units at bedtime in the past but could not afford it anymore  Pt checks her sugars twice a day: 30 day ave: 155 - am: 100-150 >> 160-200 >> 98, 130-140, 160 >> 105, 120-189, 215, 233 - 2h after b'fast: n/c - before lunch:  107-129 >> n/c - 2h after lunch: n/c - before dinner: 120-127 >> n/c >> 125-130 >> n/c - 2h after dinner: 148-240 (last 2 weeks: 169-185)  >> n/c - bedtime: 180-200 >> see above >> 160-200 >> up to 184 >> n/c - nighttime: n/c Lowest sugar was 114 >> 100 >> 116 >> 98 >> 105; she has hypoglycemia  awareness at 100. Highest sugar was 200 >> 240 >> 300 >> 184 >> 233.  Glucometer: [employer]  Pt's meals are: - Breakfast: protein shake, cheese, sliced meat, toast - break: yoghurt - Lunch: sandwich or hamburger - break: banana - Dinner: chicken/steak + salad + starch - Dessert: cookies She does not like vegetables and fruits.  -No CKD, last BUN/creatinine: 07/23/2019: BUN/Cr 21/0.71 10/04/2016: BUN/Cr 10/0.76, Glu 277 07/14/2016: BUN/Cr 10/0.67, Glu 213 01/13/2016: UACR <0.7 No results found for: BUN, CREATININE  On lisinopril.  -+ HL; last set of lipids: 07/23/2019: 169/104/53/97 10/04/2016: 178/90/42/117 09/30/2015: 207/110/49/136 No results found for: CHOL, HDL, LDLCALC, LDLDIRECT, TRIG, CHOLHDL  On pravastatin 20 >> now 40 mg daily  - last eye exam was in 04/2019: No DR reportedly  - she denies numbness and tingling in her feet.  + Callus.  She sees podiatry.  ROS: Constitutional: no weight gain/no weight loss, no fatigue, no subjective hyperthermia, no subjective hypothermia Eyes: no blurry vision, no xerophthalmia ENT: no sore throat, no nodules palpated in neck, no dysphagia, no odynophagia, no hoarseness Cardiovascular: no CP/no SOB/no palpitations/no leg swelling Respiratory: no cough/no SOB/no wheezing Gastrointestinal: no N/no V/no D/no C/no acid reflux Musculoskeletal: no muscle aches/no joint aches Skin: no rashes, no hair loss Neurological: no tremors/no numbness/no tingling/no dizziness  I reviewed pt's medications, allergies, PMH, social hx, family hx, and changes were documented in the history of present illness. Otherwise,  unchanged from my initial visit note.  PMH: - DM2 - HTN - HL - Depression  No past surgical history on file.   Social History   Social History  . Marital status: single    Spouse name: N/A  . Number of children: 5   Occupational History  . Insurance documentation specialist   Social History Main Topics  . Smoking  status: No  . Smokeless tobacco: No  . Alcohol use Wine, 2x weekly  . Drug use: No   Current Outpatient Medications  Medication Sig Dispense Refill  . dapagliflozin propanediol (FARXIGA) 10 MG TABS tablet Take 1 tablet (10 mg total) by mouth daily before breakfast. 90 tablet 3  . glipiZIDE (GLUCOTROL) 5 MG tablet Take 1 tablet (5 mg total) by mouth daily before supper. 90 tablet 3  . insulin degludec (TRESIBA FLEXTOUCH) 200 UNIT/ML FlexTouch Pen Inject 50 Units into the skin daily. 6 mL 3  . Insulin Pen Needle 32G X 4 MM MISC Use 1x a day 100 each 3  . lisinopril (PRINIVIL,ZESTRIL) 10 MG tablet Take 10 mg by mouth daily.    . pravastatin (PRAVACHOL) 10 MG tablet Take 20 mg by mouth daily.      No current facility-administered medications for this visit.   No Known Allergies   Family History  Problem Relation Age of Onset  . Diabetes Mother   . Hypertension Mother   . Hyperlipidemia Mother   . Heart disease Mother   . Hypertension Father   . Hyperlipidemia Father   . Heart disease Father   . Cancer Father   . Cancer Sister    Pt has FH of DM in mother.  PE: BP 120/78   Pulse 80   Ht 5\' 6"  (1.676 m)   Wt 215 lb 11.2 oz (97.8 kg)   SpO2 97%   BMI 34.81 kg/m  Wt Readings from Last 3 Encounters:  03/27/20 215 lb 11.2 oz (97.8 kg)  12/13/19 211 lb 3.2 oz (95.8 kg)  12/20/17 216 lb (98 kg)   Constitutional: overweight, in NAD Eyes: PERRLA, EOMI, no exophthalmos ENT: moist mucous membranes, no thyromegaly, no cervical lymphadenopathy Cardiovascular: RRR, No MRG Respiratory: CTA B Gastrointestinal: abdomen soft, NT, ND, BS+ Musculoskeletal: no deformities, strength intact in all 4 Skin: moist, warm, no rashes Neurological: no tremor with outstretched hands, DTR normal in all 4  ASSESSMENT: 1. DM2, insulin-dependent, uncontrolled, without long term complications, but with hyperglycemia  2. HL  3.  Obesity class I  PLAN:  1. Patient with longstanding, uncontrolled,  type 2 diabetes, previously with much better control after starting a GLP-1 receptor agonist, however, off the medication after this stopped being covered.  We had to switch to NPH since analog insulins were not covered, but she got a better insurance last year and we could start 02/19/18.  Also, before the change in insurance, she had to come off the SGLT2 inhibitor but we were able to add Farxiga back before last visit.  Initially HbA1c increased to 8.4%, but it started to improve after we were able to add back previous medications and latest HbA1c from last visit was better, at 7.4%.  At that time, however, sugars were still above target in the morning so we added glipizide before dinner.  She was not checking sugars at night consistently and I strongly advised her to do so. -At today's visit, she is only checking sugars in the morning and they are higher than she did not test her  blood sugars in the 200s, around the time of Thanksgiving.  I strongly advised her to also check sugars later in the day.  However, upon questioning, she is taking glipizide incorrectly, after dinner, rather than before.  I advised her to move this 20 to 30 minutes before dinner.  However, we also discussed about trying to add back Ozempic, which appears to be covered by her insurance now.  We discussed that in case we are able to start this, she will probably need to use glipizide only on an as-needed basis, before larger meals. -Will start Ozempic at a low dose and increase as tolerated.  I gave her a coupon for the medication. - I suggested to:  Patient Instructions  Please continue: - Farxiga 10 mg before b'fast - Tresiba 50 units daily   Please move: - Glipizide 5 mg 20-30 min before dinner (after you start Ozempic, you need to use this only before large meals)  Please start: - Ozempic 0.25 mg weekly in a.m. (for example on Sunday morning) x 4 weeks, then increase to 0.5 mg weekly in a.m. if no nausea or  hypoglycemia.  Please check sugars at different times of the day.  Please return in 3-4 months with your sugar log   - we checked her HbA1c: 7.7% (higher) - advised to check sugars at different times of the day - 1-2x a day, rotating check times - advised for yearly eye exams >> she is UTD - return to clinic in 3-4 months   2. HL -Reviewed latest lipid panel from 07/2019: LDL at goal, as are the rest of the fractions -Continues pravastatin 40 mg daily without side effects  3.  Obesity class I -Continues SGLT2 inhibitor which should also help with weight loss.  We will also try to add a GLP-1 receptor agonist. -She lost 5 pounds in the 2 years prior to our last visit, gained 4 pounds since last visit  Carlus Pavlov, MD PhD [facility]

## 2020-03-27 NOTE — Patient Instructions (Addendum)
Please continue: - Farxiga 10 mg before b'fast - Tresiba 50 units daily   Please move: - Glipizide 5 mg 20-30 min before dinner (after you start Ozempic, you need to use this only before large meals)  Please start: - Ozempic 0.25 mg weekly in a.m. (for example on Sunday morning) x 4 weeks, then increase to 0.5 mg weekly in a.m. if no nausea or hypoglycemia.  Please check sugars at different times of the day.  Please return in 3-4 months with your sugar log

## 2020-05-06 ENCOUNTER — Other Ambulatory Visit: Payer: Self-pay | Admitting: Internal Medicine

## 2020-05-06 DIAGNOSIS — E1165 Type 2 diabetes mellitus with hyperglycemia: Secondary | ICD-10-CM

## 2020-05-07 ENCOUNTER — Other Ambulatory Visit: Payer: Self-pay | Admitting: Family Medicine

## 2020-05-07 DIAGNOSIS — N6320 Unspecified lump in the left breast, unspecified quadrant: Secondary | ICD-10-CM

## 2020-06-05 DIAGNOSIS — E785 Hyperlipidemia, unspecified: Secondary | ICD-10-CM | POA: Diagnosis not present

## 2020-06-05 DIAGNOSIS — E1169 Type 2 diabetes mellitus with other specified complication: Secondary | ICD-10-CM | POA: Diagnosis not present

## 2020-06-05 DIAGNOSIS — Z0000 Encounter for general adult medical examination without abnormal findings: Secondary | ICD-10-CM | POA: Diagnosis not present

## 2020-06-18 DIAGNOSIS — L570 Actinic keratosis: Secondary | ICD-10-CM | POA: Diagnosis not present

## 2020-06-18 DIAGNOSIS — D485 Neoplasm of uncertain behavior of skin: Secondary | ICD-10-CM | POA: Diagnosis not present

## 2020-06-18 DIAGNOSIS — D229 Melanocytic nevi, unspecified: Secondary | ICD-10-CM | POA: Diagnosis not present

## 2020-06-24 ENCOUNTER — Ambulatory Visit
Admission: RE | Admit: 2020-06-24 | Discharge: 2020-06-24 | Disposition: A | Discharge status: Home / Self Care | Payer: BC Managed Care – PPO | Source: Ambulatory Visit | Attending: Family Medicine | Admitting: Family Medicine

## 2020-06-24 ENCOUNTER — Other Ambulatory Visit: Payer: Self-pay

## 2020-06-24 DIAGNOSIS — N6320 Unspecified lump in the left breast, unspecified quadrant: Secondary | ICD-10-CM

## 2020-06-24 DIAGNOSIS — R922 Inconclusive mammogram: Secondary | ICD-10-CM | POA: Diagnosis not present

## 2020-06-24 DIAGNOSIS — N6321 Unspecified lump in the left breast, upper outer quadrant: Secondary | ICD-10-CM | POA: Diagnosis not present

## 2020-07-31 ENCOUNTER — Other Ambulatory Visit: Payer: Self-pay

## 2020-07-31 ENCOUNTER — Ambulatory Visit: Payer: BC Managed Care – PPO | Admitting: Internal Medicine

## 2020-07-31 ENCOUNTER — Encounter: Payer: Self-pay | Admitting: Internal Medicine

## 2020-07-31 VITALS — BP 128/78 | HR 89 | Ht 66.0 in | Wt 210.8 lb

## 2020-07-31 DIAGNOSIS — E669 Obesity, unspecified: Secondary | ICD-10-CM

## 2020-07-31 DIAGNOSIS — Z794 Long term (current) use of insulin: Secondary | ICD-10-CM

## 2020-07-31 DIAGNOSIS — E1165 Type 2 diabetes mellitus with hyperglycemia: Secondary | ICD-10-CM

## 2020-07-31 DIAGNOSIS — E785 Hyperlipidemia, unspecified: Secondary | ICD-10-CM | POA: Diagnosis not present

## 2020-07-31 DIAGNOSIS — E66811 Obesity, class 1: Secondary | ICD-10-CM

## 2020-07-31 LAB — POCT GLYCOSYLATED HEMOGLOBIN (HGB A1C): Hemoglobin A1C: 6.5 % — AB (ref 4.0–5.6)

## 2020-07-31 MED ORDER — TRESIBA FLEXTOUCH 200 UNIT/ML ~~LOC~~ SOPN: 50.0000 [IU] | PEN_INJECTOR | Freq: Every day | SUBCUTANEOUS | 3 refills | Status: DC

## 2020-07-31 NOTE — Progress Notes (Signed)
Patient ID: Destiny Munoz, female   DOB: Mar 20, 1956, 65 y.o.   MRN: [id number]   This visit occurred during the SARS-CoV-2 public health emergency.  Safety protocols were in place, including screening questions prior to the visit, additional usage of staff PPE, and extensive cleaning of exam room while observing appropriate contact time as indicated for disinfecting solutions.   HPI: Destiny Munoz is a 65 y.o.-year-old female, presenting for follow-up for DM2, dx as GDM - 1995, then as DM2 in 2010, insulin-dependent since 05/2016, uncontrolled, without long-term complications. Last visit 4 months ago. She changed her insurance in 2021.  Interim history: We started Ozempic at last OV >> tolerates it well. No increased urination, blurry vision, nausea. She had diarrhea after the flu shot.  Reviewed HbA1c levels: Lab Results  Component Value Date   HGBA1C 7.7 (A) 03/27/2020   HGBA1C 7.4 (A) 12/13/2019   HGBA1C 7.8 07/23/2019  07/14/2016: HbA1c 10.1% 04/14/2016: HbA1c 11.3% 01/13/2016: HbA1c 10.4% 09/30/2015: HbA1c 10.9%  She is on: - NPH 20 >> 30 units in a.m. and 25 >> 30 units at night >> Tresiba 50 units daily - Glipizide 5 mg before larger dinner-added 06/2018 >> restarted 11/2019 >> now as needed before a larger dinner  - Farxiga 10 mg before breakfast >> Jardiance 25 mg daily - ran out 01/2019 >> Farxiga 10 mg before breakfast - Ozempic 0.5 mg weekly - started 03/2020 Ozempic was not affordable, unfortunately. We tried Jardiance 25 mg >> $. She tried metformin R and ER, but it caused GI upset (N/D) She ran out of Glipizide ER 10 mg.   She was on Lantus 30 units at bedtime in the past but could not afford it anymore  Pt checks her sugars twice a day: - am: 160-200 >> 98, 130-140, 160 >> 105, 120-189, 215, 233 >> 89-154, 164, 176 - 2h after b'fast: n/c - before lunch:  107-129 >> n/c - 2h after lunch: n/c - before dinner: 120-127 >> n/c >> 125-130 >> n/c - 2h  after dinner: 148-240 (last 2 weeks: 169-185)  >> n/c - bedtime: 160-200 >> up to 184 >> n/c >> 116, 138-207, 228, 257 - nighttime: n/c Lowest sugar was 98 >> 105; she has hypoglycemia awareness at 100. Highest sugar was 300 >> 184 >> 233.  Glucometer: [employer]  Pt's meals are: - Breakfast: protein shake, cheese, sliced meat, toast - break: yoghurt - Lunch: sandwich or hamburger - break: banana - Dinner: chicken/steak + salad + starch - Dessert: cookies She does not like vegetables and fruits.  -No CKD, last BUN/creatinine: 06/05/2020: 13/0.81, glucose 98, ACR <20 07/23/2019: BUN/Cr 21/0.71 10/04/2016: BUN/Cr 10/0.76, Glu 277 07/14/2016: BUN/Cr 10/0.67, Glu 213 01/13/2016: UACR <0.7 No results found for: BUN, CREATININE  On lisinopril.  -+ HL; last set of lipids: 06/05/2020: 165/82/53/97 07/23/2019: 169/104/53/97 10/04/2016: 178/90/42/117 09/30/2015: 207/110/49/136 No results found for: CHOL, HDL, LDLCALC, LDLDIRECT, TRIG, CHOLHDL  On pravastatin 20 >> 40 mg daily  - last eye exam was in 04/2019: No DR reportedly  - she denies numbness and tingling in her feet.  + Callus.  She sees podiatry.  ROS: Constitutional: no weight gain/no weight loss, no fatigue, no subjective hyperthermia, no subjective hypothermia Eyes: no blurry vision, no xerophthalmia ENT: no sore throat, no nodules palpated in neck, no dysphagia, no odynophagia, no hoarseness Cardiovascular: no CP/no SOB/no palpitations/no leg swelling Respiratory: no cough/no SOB/no wheezing Gastrointestinal: no N/no V/no D/no C/no acid reflux Musculoskeletal: no muscle aches/no joint aches  Skin: no rashes, no hair loss Neurological: no tremors/no numbness/no tingling/no dizziness  I reviewed pt's medications, allergies, PMH, social hx, family hx, and changes were documented in the history of present illness. Otherwise, unchanged from my initial visit note.  PMH: - DM2 - HTN - HL - Depression  No past surgical  history on file.   Social History   Social History  . Marital status: single    Spouse name: N/A  . Number of children: 5   Occupational History  . Insurance documentation specialist   Social History Main Topics  . Smoking status: No  . Smokeless tobacco: No  . Alcohol use Wine, 2x weekly  . Drug use: No   Current Outpatient Medications  Medication Sig Dispense Refill  . dapagliflozin propanediol (FARXIGA) 10 MG TABS tablet Take 1 tablet (10 mg total) by mouth daily before breakfast. 90 tablet 3  . glipiZIDE (GLUCOTROL) 5 MG tablet Take 1 tablet (5 mg total) by mouth daily before supper. 90 tablet 3  . Insulin Pen Needle 32G X 4 MM MISC Use 1x a day 100 each 3  . lisinopril (PRINIVIL,ZESTRIL) 10 MG tablet Take 10 mg by mouth daily.    . pravastatin (PRAVACHOL) 10 MG tablet Take 20 mg by mouth daily.     . Semaglutide,0.25 or 0.5MG /DOS, (OZEMPIC, 0.25 OR 0.5 MG/DOSE,) 2 MG/1.5ML SOPN Inject 0.5 mg into the skin once a week. 4.5 mL 3  . TRESIBA FLEXTOUCH 200 UNIT/ML FlexTouch Pen INJECT 50 UNITS INTO THE SKIN DAILY. 6 mL 3   No current facility-administered medications for this visit.   No Known Allergies   Family History  Problem Relation Age of Onset  . Diabetes Mother   . Hypertension Mother   . Hyperlipidemia Mother   . Heart disease Mother   . Hypertension Father   . Hyperlipidemia Father   . Heart disease Father   . Cancer Father   . Cancer Sister   . Breast cancer Sister 75   Pt has FH of DM in mother.  PE: BP 128/78 (BP Location: Right Arm, Patient Position: Sitting, Cuff Size: Normal)   Pulse 89   Ht 5\' 6"  (1.676 m)   Wt 210 lb 12.8 oz (95.6 kg)   SpO2 97%   BMI 34.02 kg/m  Wt Readings from Last 3 Encounters:  07/31/20 210 lb 12.8 oz (95.6 kg)  03/27/20 215 lb 11.2 oz (97.8 kg)  12/13/19 211 lb 3.2 oz (95.8 kg)   Constitutional: overweight, in NAD Eyes: PERRLA, EOMI, no exophthalmos ENT: moist mucous membranes, no thyromegaly, no cervical  lymphadenopathy Cardiovascular: RRR, + 1/6 SEM, no RG Respiratory: CTA B Gastrointestinal: abdomen soft, NT, ND, BS+ Musculoskeletal: no deformities, strength intact in all 4 Skin: moist, warm, no rashes Neurological: no tremor with outstretched hands, DTR normal in all 4  ASSESSMENT: 1. DM2, insulin-dependent, uncontrolled, without long term complications, but with hyperglycemia  2. HL  3.  Obesity class I  PLAN:  1. Patient with longstanding, uncontrolled, type 2 diabetes, previously with much better control after starting a GLP-1 receptor agonist, but she had to come off due to price.  We had to switch to NPH insulin since analog insulins were not covered, but she got a better insurance last year so we could switch to 12/15/19 and at last visit we also started Ozempic.  Restarted at a low dose and I advised her to increase as tolerated.  In the meantime, I advised her to only take glipizide  as needed before a larger meal.  She was previously taking it after dinner.  Latest HbA1c was 7.7%, higher. -At today's visit, sugars are improved with occasional hyperglycemic values, especially at bedtime.  She is taking the glipizide as needed and I advised her to write down possible causes for the high blood sugars in her log so that she can tailor the glipizide better.  We also discussed about possibly increasing the dose of Ozempic since she is tolerating well the lower dose, but will hold off for now, due to the improvement in her blood sugars, per her preference. - I suggested to:  Patient Instructions  Please continue: - Farxiga 10 mg before b'fast - Glipizide 5 mg as needed 20-30 min before a larger dinner  - Tresiba 50 units daily  - Ozempic 0.5 mg weekly  Please return in 4 months with your sugar log   - we checked her HbA1c: 6.5% (better) - advised to check sugars at different times of the day - 1-2x a day, rotating check times - advised for yearly eye exams >> she is not UTD - return  to clinic in 4 months   2. HL -Reviewed latest lipid panel from 07/2019: LDL at goal, as were the rest of the fractions -Continues pravastatin 40 mg daily without side effects  3.  Obesity class I -continue SGLT 2 inhibitor and GLP-1 receptor agonist which should also help with weight loss -He gained 4 pounds before last visit, lost 5 lbs since then  Carlus Pavlov, MD PhD [facility]

## 2020-07-31 NOTE — Patient Instructions (Addendum)
Please continue: - Farxiga 10 mg before b'fast - Glipizide 5 mg as needed 20-30 min before a larger dinner  - Tresiba 50 units daily  - Ozempic 0.5 mg weekly  Please return in 4 months with your sugar log

## 2020-08-09 DIAGNOSIS — Z03818 Encounter for observation for suspected exposure to other biological agents ruled out: Secondary | ICD-10-CM | POA: Diagnosis not present

## 2020-08-09 DIAGNOSIS — J069 Acute upper respiratory infection, unspecified: Secondary | ICD-10-CM | POA: Diagnosis not present

## 2020-08-09 DIAGNOSIS — R059 Cough, unspecified: Secondary | ICD-10-CM | POA: Diagnosis not present

## 2020-08-09 DIAGNOSIS — R509 Fever, unspecified: Secondary | ICD-10-CM | POA: Diagnosis not present

## 2020-08-15 DIAGNOSIS — B9689 Other specified bacterial agents as the cause of diseases classified elsewhere: Secondary | ICD-10-CM | POA: Diagnosis not present

## 2020-08-15 DIAGNOSIS — J019 Acute sinusitis, unspecified: Secondary | ICD-10-CM | POA: Diagnosis not present

## 2020-09-12 DIAGNOSIS — M9901 Segmental and somatic dysfunction of cervical region: Secondary | ICD-10-CM | POA: Diagnosis not present

## 2020-09-12 DIAGNOSIS — M542 Cervicalgia: Secondary | ICD-10-CM | POA: Diagnosis not present

## 2020-09-12 DIAGNOSIS — M9902 Segmental and somatic dysfunction of thoracic region: Secondary | ICD-10-CM | POA: Diagnosis not present

## 2020-09-12 DIAGNOSIS — M546 Pain in thoracic spine: Secondary | ICD-10-CM | POA: Diagnosis not present

## 2020-09-23 DIAGNOSIS — M9901 Segmental and somatic dysfunction of cervical region: Secondary | ICD-10-CM | POA: Diagnosis not present

## 2020-09-23 DIAGNOSIS — M9902 Segmental and somatic dysfunction of thoracic region: Secondary | ICD-10-CM | POA: Diagnosis not present

## 2020-09-23 DIAGNOSIS — M542 Cervicalgia: Secondary | ICD-10-CM | POA: Diagnosis not present

## 2020-09-23 DIAGNOSIS — M546 Pain in thoracic spine: Secondary | ICD-10-CM | POA: Diagnosis not present

## 2020-09-30 DIAGNOSIS — M542 Cervicalgia: Secondary | ICD-10-CM | POA: Diagnosis not present

## 2020-09-30 DIAGNOSIS — M9902 Segmental and somatic dysfunction of thoracic region: Secondary | ICD-10-CM | POA: Diagnosis not present

## 2020-09-30 DIAGNOSIS — M9901 Segmental and somatic dysfunction of cervical region: Secondary | ICD-10-CM | POA: Diagnosis not present

## 2020-09-30 DIAGNOSIS — M546 Pain in thoracic spine: Secondary | ICD-10-CM | POA: Diagnosis not present

## 2020-10-01 DIAGNOSIS — R0781 Pleurodynia: Secondary | ICD-10-CM | POA: Diagnosis not present

## 2020-11-30 ENCOUNTER — Other Ambulatory Visit (HOSPITAL_COMMUNITY): Payer: Self-pay

## 2020-12-03 ENCOUNTER — Other Ambulatory Visit: Payer: Self-pay

## 2020-12-03 ENCOUNTER — Ambulatory Visit: Payer: 59 | Admitting: Internal Medicine

## 2020-12-03 ENCOUNTER — Encounter: Payer: Self-pay | Admitting: Internal Medicine

## 2020-12-03 VITALS — BP 128/80 | HR 84 | Ht 66.0 in | Wt 204.4 lb

## 2020-12-03 DIAGNOSIS — E669 Obesity, unspecified: Secondary | ICD-10-CM

## 2020-12-03 DIAGNOSIS — E1165 Type 2 diabetes mellitus with hyperglycemia: Secondary | ICD-10-CM | POA: Diagnosis not present

## 2020-12-03 DIAGNOSIS — Z794 Long term (current) use of insulin: Secondary | ICD-10-CM | POA: Diagnosis not present

## 2020-12-03 DIAGNOSIS — E785 Hyperlipidemia, unspecified: Secondary | ICD-10-CM | POA: Diagnosis not present

## 2020-12-03 LAB — POCT GLYCOSYLATED HEMOGLOBIN (HGB A1C): Hemoglobin A1C: 6.2 % — AB (ref 4.0–5.6)

## 2020-12-03 MED ORDER — OZEMPIC (1 MG/DOSE) 4 MG/3ML ~~LOC~~ SOPN: 1.0000 mg | PEN_INJECTOR | SUBCUTANEOUS | 3 refills | Status: DC

## 2020-12-03 NOTE — Patient Instructions (Addendum)
Please continue: - Farxiga 10 mg before b'fast - Glipizide 5 mg as needed 20-30 min before a larger dinner  - Tresiba 50 units daily   Please increase: - Ozempic 1 mg weekly  Please return in 4 months with your sugar log

## 2020-12-03 NOTE — Progress Notes (Signed)
Patient ID: Destiny Munoz, female   DOB: [birth year], 65 y.o.   MRN: [id number]   This visit occurred during the SARS-CoV-2 public health emergency.  Safety protocols were in place, including screening questions prior to the visit, additional usage of staff PPE, and extensive cleaning of exam room while observing appropriate contact time as indicated for disinfecting solutions.   HPI: Destiny Munoz is a 65 y.o.-year-old female, presenting for follow-up for DM2, dx as GDM - 1995, then as DM2 in 2010, insulin-dependent since 05/2016, uncontrolled, without long-term complications. Last visit 4 months ago. She changed her insurance in 2021.  Interim history: No increased urination, blurry vision, nausea, chest pain. She had a prolonged URI in 07/2020 >> cough, congestion - had ABx, steroids.  Now feeling better  Reviewed HbA1c levels: Lab Results  Component Value Date   HGBA1C 6.5 (A) 07/31/2020   HGBA1C 7.7 (A) 03/27/2020   HGBA1C 7.4 (A) 12/13/2019  07/14/2016: HbA1c 10.1% 04/14/2016: HbA1c 11.3% 01/13/2016: HbA1c 10.4% 09/30/2015: HbA1c 10.9%  She is on: - Tresiba 50 units daily - Glipizide 5 mg before larger dinner  - Farxiga 10 mg before breakfast - Ozempic 0.5 mg weekly - started 03/2020 She tried metformin R and ER, but it caused GI upset (N/D) Previously on glipizide ER 10 mg.   She was on Lantus 30 units at bedtime in the past. She was previously on NPH due to price.  Pt checks her sugars twice a day: - am: 105, 120-189, 215, 233 >> 89-154, 164, 176 >> 77, 83-147, 156, 177 - 2h after b'fast: n/c - before lunch:  107-129 >> n/c - 2h after lunch: n/c - before dinner: 120-127 >> n/c >> 125-130 >> n/c - 2h after dinner: 148-240 (last 2 weeks: 169-185)  >> n/c - bedtime: up to 184 >> n/c >> 116, 138-207, 228, 257 >> 126-184, 192, 224 - nighttime: n/c Lowest sugar was 98 >> 105 >> 80 >> 77; she has hypoglycemia awareness at 100. Highest sugar was 300 >> 184 >>  233 >> 224.  Glucometer: [employer]  Pt's meals are: - Breakfast: protein shake, cheese, sliced meat, toast - break: yoghurt - Lunch: sandwich or hamburger - break: banana - Dinner: chicken/steak + salad + starch - Dessert: cookies She does not like vegetables and fruits.  -No CKD, last BUN/creatinine: 06/05/2020: 13/0.81, glucose 98, ACR <20 07/23/2019: BUN/Cr 21/0.71 10/04/2016: BUN/Cr 10/0.76, Glu 277 07/14/2016: BUN/Cr 10/0.67, Glu 213 01/13/2016: UACR <0.7 No results found for: BUN, CREATININE  On lisinopril.  -+ HL; last set of lipids: 06/05/2020: 165/82/53/97 07/23/2019: 169/104/53/97 10/04/2016: 178/90/42/117 09/30/2015: 207/110/49/136 No results found for: CHOL, HDL, LDLCALC, LDLDIRECT, TRIG, CHOLHDL  On pravastatin 20 >> 40 mg daily  - last eye exam was in 04/2019: No DR reportedly  - she denies numbness and tingling in her feet.  + Callus.  She sees podiatry.  ROS: Constitutional: no weight gain/no weight loss, no fatigue, no subjective hyperthermia, no subjective hypothermia Eyes: no blurry vision, no xerophthalmia ENT: no sore throat, no nodules palpated in neck, no dysphagia, no odynophagia, no hoarseness Cardiovascular: no CP/no SOB/no palpitations/no leg swelling Respiratory: no cough/no SOB/no wheezing Gastrointestinal: no N/no V/no D/no C/no acid reflux Musculoskeletal: no muscle aches/no joint aches Skin: no rashes, no hair loss Neurological: no tremors/no numbness/no tingling/no dizziness  I reviewed pt's medications, allergies, PMH, social hx, family hx, and changes were documented in the history of present illness. Otherwise, unchanged from my initial visit note.  PMH: -  DM2 - HTN - HL - Depression  No past surgical history on file.   Social History   Social History   Marital status: single    Spouse name: N/A   Number of children: 5   Occupational History   Financial trader   Social History Main Topics   Smoking  status: No   Smokeless tobacco: No   Alcohol use Wine, 2x weekly   Drug use: No   Current Outpatient Medications  Medication Sig Dispense Refill   dapagliflozin propanediol (FARXIGA) 10 MG TABS tablet Take 1 tablet (10 mg total) by mouth daily before breakfast. 90 tablet 3   glipiZIDE (GLUCOTROL) 5 MG tablet Take 1 tablet (5 mg total) by mouth daily before supper. 90 tablet 3   insulin degludec (TRESIBA FLEXTOUCH) 200 UNIT/ML FlexTouch Pen Inject 50 Units into the skin daily. 18 mL 3   Insulin Pen Needle 32G X 4 MM MISC Use 1x a day 100 each 3   lisinopril (PRINIVIL,ZESTRIL) 10 MG tablet Take 10 mg by mouth daily.     pravastatin (PRAVACHOL) 10 MG tablet Take 20 mg by mouth daily.      Semaglutide,0.25 or 0.5MG /DOS, (OZEMPIC, 0.25 OR 0.5 MG/DOSE,) 2 MG/1.5ML SOPN Inject 0.5 mg into the skin once a week. 4.5 mL 3   No current facility-administered medications for this visit.   No Known Allergies   Family History  Problem Relation Age of Onset   Diabetes Mother    Hypertension Mother    Hyperlipidemia Mother    Heart disease Mother    Hypertension Father    Hyperlipidemia Father    Heart disease Father    Cancer Father    Cancer Sister    Breast cancer Sister 50   Pt has FH of DM in mother.  PE: BP 128/80 (BP Location: Left Arm, Patient Position: Sitting, Cuff Size: Normal)   Pulse 84   Ht 5\' 6"  (1.676 m)   Wt 204 lb 6.4 oz (92.7 kg)   SpO2 98%   BMI 32.99 kg/m  Wt Readings from Last 3 Encounters:  12/03/20 204 lb 6.4 oz (92.7 kg)  07/31/20 210 lb 12.8 oz (95.6 kg)  03/27/20 215 lb 11.2 oz (97.8 kg)   Constitutional: overweight, in NAD Eyes: PERRLA, EOMI, no exophthalmos ENT: moist mucous membranes, no thyromegaly, no cervical lymphadenopathy Cardiovascular: RRR, no MRG Respiratory: CTA B Gastrointestinal: abdomen soft, NT, ND, BS+ Musculoskeletal: no deformities, strength intact in all 4 Skin: moist, warm, no rashes Neurological: no tremor with outstretched  hands, DTR normal in all 4  ASSESSMENT: 1. DM2, insulin-dependent, uncontrolled, without long term complications, but with hyperglycemia  2. HL  3.  Obesity class I  PLAN:  1. Patient with longstanding, uncontrolled, type 2 diabetes, previously with much better control after starting a GLP-1 receptor agonist, but with worsening control after she had to come off the medication due to price.  We had to switch to NPH insulin in the past as agonist insulins were not covered but she got a better insurance in 2021 so we could switch to 2022 and we were also able to restart Ozempic.  Her blood sugars improved afterwards and at last visit she only had occasional hyperglycemic spikes, especially at bedtime.  She was taking glipizide as needed and I advised her to write down possible causes for high blood sugars in her log so we can tailor the glipizide better.  We continued the same regimen at that time.  HbA1c  was 6.5%, improved from 7.7%. -At today's visit, sugars are mostly at goal in the morning but they are higher at that time, with values above target, even in the 200s.  She is taking glipizide occasionally before the larger meals and will continue this.  However, sugars may increase despite glipizide.  For now, we discussed about increasing the Ozempic dose to 1 mg weekly to improve her sugars later in the day.  We can continue the rest of the regimen for now. - I suggested to:  Patient Instructions  Please continue: - Farxiga 10 mg before b'fast - Glipizide 5 mg as needed 20-30 min before a larger dinner  - Tresiba 50 units daily   Please increase: - Ozempic 1 mg weekly  Please return in 4 months with your sugar log   - we checked her HbA1c: 6.2% (lower) - advised to check sugars at different times of the day - 1-2x a day, rotating check times - advised for yearly eye exams >> she is not UTD - return to clinic in 4 months  2. HL -Reviewed latest lipid from 05/2020: Fractions at  goal No results found for: CHOL, HDL, LDLCALC, LDLDIRECT, TRIG, CHOLHDL -Continues pravastatin 40 mg daily without side effects  3.  Obesity class I -continue SGLT 2 inhibitor and GLP-1 receptor agonist which should also help with weight loss -He lost 5 pounds before last visit, lost 6 more since last OV  Carlus Pavlov, MD PhD [facility] Endocrinology

## 2020-12-12 ENCOUNTER — Other Ambulatory Visit: Payer: Self-pay | Admitting: Internal Medicine

## 2021-04-08 ENCOUNTER — Encounter: Payer: Self-pay | Admitting: Internal Medicine

## 2021-04-08 ENCOUNTER — Ambulatory Visit (INDEPENDENT_AMBULATORY_CARE_PROVIDER_SITE_OTHER): Payer: 59 | Admitting: Internal Medicine

## 2021-04-08 ENCOUNTER — Other Ambulatory Visit: Payer: Self-pay

## 2021-04-08 VITALS — BP 136/74 | HR 97 | Ht 66.0 in | Wt 205.8 lb

## 2021-04-08 DIAGNOSIS — E669 Obesity, unspecified: Secondary | ICD-10-CM | POA: Diagnosis not present

## 2021-04-08 DIAGNOSIS — E785 Hyperlipidemia, unspecified: Secondary | ICD-10-CM | POA: Diagnosis not present

## 2021-04-08 DIAGNOSIS — Z794 Long term (current) use of insulin: Secondary | ICD-10-CM

## 2021-04-08 DIAGNOSIS — E1165 Type 2 diabetes mellitus with hyperglycemia: Secondary | ICD-10-CM | POA: Diagnosis not present

## 2021-04-08 LAB — POCT GLYCOSYLATED HEMOGLOBIN (HGB A1C): Hemoglobin A1C: 6.4 % — AB (ref 4.0–5.6)

## 2021-04-08 NOTE — Progress Notes (Signed)
Patient ID: Destiny Munoz, female   DOB: March 28, 1955, 66 y.o.   MRN: [id number]   This visit occurred during the SARS-CoV-2 public health emergency.  Safety protocols were in place, including screening questions prior to the visit, additional usage of staff PPE, and extensive cleaning of exam room while observing appropriate contact time as indicated for disinfecting solutions.   HPI: Destiny Munoz is a 66 y.o.-year-old female, presenting for follow-up for DM2, dx as GDM - 1995, then as DM2 in 2010, insulin-dependent since 05/2016, uncontrolled, without long-term complications. Last visit 4 months ago. She changed her insurance in 2021.  Interim history: No increased urination, blurry vision, nausea, chest pain.  Reviewed HbA1c levels: Lab Results  Component Value Date   HGBA1C 6.2 (A) 12/03/2020   HGBA1C 6.5 (A) 07/31/2020   HGBA1C 7.7 (A) 03/27/2020  07/14/2016: HbA1c 10.1% 04/14/2016: HbA1c 11.3% 01/13/2016: HbA1c 10.4% 09/30/2015: HbA1c 10.9%  She is on: - Tresiba 50 units daily - Glipizide 5 mg before larger dinner - actually still taking it at bedtime prn  - Farxiga 10 mg before breakfast - Ozempic 0.5 mg weekly - started 03/2020 >> 1 mg weekly She tried metformin R and ER, but it caused GI upset (N/D) Previously on glipizide ER 10 mg.   She was on Lantus 30 units at bedtime in the past. She was previously on NPH due to price.  Pt checks her sugars twice a day: - am: 89-154, 164, 176 >> 77, 83-147, 156, 177 >> 91, 93-137, 150, 164 - 2h after b'fast: n/c - before lunch:  107-129 >> n/c - 2h after lunch: n/c - before dinner: 120-127 >> n/c >> 125-130 >> n/c - 2h after dinner: 148-240 (last 2 weeks: 169-185)  >> n/c - bedtime:  116, 138-207, 228, 257 >> 126-184, 192, 224 >> 106-210, 240 - nighttime: n/c Lowest sugar was  77 >> 91; she has hypoglycemia awareness at 100. Highest sugar was 233 >> 224 >> 240.  Glucometer: [employer]  Pt's meals are: -  Breakfast: protein shake, cheese, sliced meat, toast - break: yoghurt - Lunch: sandwich or hamburger - break: banana - Dinner: chicken/steak + salad + starch - Dessert: cookies She does not like vegetables and fruits.  -No CKD, last BUN/creatinine: 06/05/2020: 13/0.81, glucose 98, ACR <20 07/23/2019: BUN/Cr 21/0.71 10/04/2016: BUN/Cr 10/0.76, Glu 277 07/14/2016: BUN/Cr 10/0.67, Glu 213 01/13/2016: UACR <0.7 No results found for: BUN, CREATININE  On lisinopril.  -+ HL; last set of lipids: 06/05/2020: 165/82/53/97 07/23/2019: 169/104/53/97 10/04/2016: 178/90/42/117 09/30/2015: 207/110/49/136 No results found for: CHOL, HDL, LDLCALC, LDLDIRECT, TRIG, CHOLHDL  On pravastatin 20 >> 40 mg daily  - last eye exam was in 03/2020: No DR reportedly  - she denies numbness and tingling in her feet.  + Callus.  She sees podiatry.  ROS: + SEE hpi  I reviewed pt's medications, allergies, PMH, social hx, family hx, and changes were documented in the history of present illness. Otherwise, unchanged from my initial visit note.  PMH: - DM2 - HTN - HL - Depression  No past surgical history on file.   Social History   Social History   Marital status: single    Spouse name: N/A   Number of children: 5   Occupational History   Financial trader   Social History Main Topics   Smoking status: No   Smokeless tobacco: No   Alcohol use Wine, 2x weekly   Drug use: No   Current Outpatient Medications  Medication  Sig Dispense Refill   FARXIGA 10 MG TABS tablet TAKE ONE TABLET BY MOUTH DAILY BEFORE BREAKFAST 90 tablet 3   glipiZIDE (GLUCOTROL) 5 MG tablet Take 1 tablet (5 mg total) by mouth daily before supper. 90 tablet 3   insulin degludec (TRESIBA FLEXTOUCH) 200 UNIT/ML FlexTouch Pen Inject 50 Units into the skin daily. 18 mL 3   Insulin Pen Needle 32G X 4 MM MISC Use 1x a day 100 each 3   lisinopril (PRINIVIL,ZESTRIL) 10 MG tablet Take 10 mg by mouth daily.      pravastatin (PRAVACHOL) 10 MG tablet Take 20 mg by mouth daily.      Semaglutide, 1 MG/DOSE, (OZEMPIC, 1 MG/DOSE,) 4 MG/3ML SOPN Inject 1 mg into the skin once a week. 9 mL 3   No current facility-administered medications for this visit.   No Known Allergies   Family History  Problem Relation Age of Onset   Diabetes Mother    Hypertension Mother    Hyperlipidemia Mother    Heart disease Mother    Hypertension Father    Hyperlipidemia Father    Heart disease Father    Cancer Father    Cancer Sister    Breast cancer Sister [id number]   Pt has FH of DM in mother.  PE: BP 136/74 (BP Location: Left Arm, Patient Position: Sitting, Cuff Size: Normal)    Pulse 97    Ht 5\' 6"  (1.676 m)    Wt 205 lb 12.8 oz (93.4 kg)    SpO2 96%    BMI 33.22 kg/m  Wt Readings from Last 3 Encounters:  04/08/21 205 lb 12.8 oz (93.4 kg)  12/03/20 204 lb 6.4 oz (92.7 kg)  07/31/20 210 lb 12.8 oz (95.6 kg)   Constitutional: overweight, in NAD Eyes: PERRLA, EOMI, no exophthalmos ENT: moist mucous membranes, no thyromegaly, no cervical lymphadenopathy Cardiovascular: tachycardia, RR, no MRG Respiratory: CTA B Musculoskeletal: no deformities, strength intact in all 4 Skin: moist, warm, no rashes Neurological: no tremor with outstretched hands, DTR normal in all 4 Diabetic Foot Exam - Simple   Simple Foot Form Diabetic Foot exam was performed with the following findings: Yes 04/08/2021 11:18 AM  Visual Inspection No deformities, no ulcerations, no other skin breakdown bilaterally: Yes Sensation Testing Intact to touch and monofilament testing bilaterally: Yes Pulse Check Posterior Tibialis and Dorsalis pulse intact bilaterally: Yes Comments    ASSESSMENT: 1. DM2, insulin-dependent, uncontrolled, without long term complications, but with hyperglycemia  2. HL  3.  Obesity class I  PLAN:  1. Patient with longstanding, uncontrolled, type 2 diabetes, previously with much better control after starting a GLP-1  receptor agonist, but worsening control after she had to come off the medication due to price.  We had to switch to NPH insulin in the past as analog insulins were not covered, but in 2021, she got a better insurance so we will switch to [place] and we were also able to restart Ozempic.  Sugars improved afterwards and at last visit, HbA1c was 6.2%, improved.  Sugars are mostly at goal in the morning but they were higher at bedtime, with values above target, even in the 200s.  She was taking glipizide occasionally before larger meals and we continued this practice but I also advised her to increase Ozempic to 1 mg weekly. -At today's visit, sugars appear to be closer to get in the morning, with only occasional mildly hyperglycemic spikes but still high blood sugars at bedtime.  Unfortunately, she is still  taking the glipizide incorrectly, despite our discussion at last visit to always take it 20 to 30 minutes before a larger meal.  She continues to take it at bedtime on an as-needed basis, if sugars are higher than 160.  At this visit, we discussed about the risks of taking it after the sugars are already high.  I strongly advised her to avoid this practice since this is conducive to low blood sugars overnight and still high blood sugars after dinner.  Since some of her after dinner blood sugars are at goal, I advised her to take the glipizide only before larger meals, and even then, she can vary the dose between half a tablet to 1 full tablet 20 to 30 minutes before the meal.  Otherwise, we can continue the same regimen. - I suggested to:  Patient Instructions  Please continue: - Farxiga 10 mg before b'fast - Glipizide 2.5-5 mg as needed 20-30 min before a larger dinner (NOT AT BEDTIME) - Tresiba 50 units daily  - Ozempic 1 mg weekly  Please return in 4 months with your sugar log   - we checked her HbA1c: 6.4% (slightly higher) - advised to check sugars at different times of the day - 2x a day, rotating  check times - advised for yearly eye exams >> she is UTD - return to clinic in 3-4 months  2. HL -Reviewed latest lipid panel from 05/2020: Fractions at goal. No results found for: CHOL, HDL, LDLCALC, LDLDIRECT, TRIG, CHOLHDL -Continues pravastatin 40 mg daily without side effects  3.  Obesity class I -continue SGLT 2 inhibitor and GLP-1 receptor agonist which should also help with weight loss -She lost 11 pounds before the last 2 visits combined - gained 1 lb since then  Carlus Pavlov, MD PhD [facility] Endocrinology

## 2021-04-08 NOTE — Patient Instructions (Signed)
Please continue: - Farxiga 10 mg before b'fast - Glipizide 2.5-5 mg as needed 20-30 min before a larger dinner (NOT AT BEDTIME) - Tresiba 50 units daily  - Ozempic 1 mg weekly  Please return in 4 months with your sugar log

## 2021-07-15 ENCOUNTER — Other Ambulatory Visit (HOSPITAL_COMMUNITY): Payer: Self-pay | Admitting: Family Medicine

## 2021-07-15 DIAGNOSIS — R011 Cardiac murmur, unspecified: Secondary | ICD-10-CM

## 2021-07-27 ENCOUNTER — Ambulatory Visit (HOSPITAL_COMMUNITY): Payer: 59 | Attending: Cardiology

## 2021-07-27 DIAGNOSIS — R011 Cardiac murmur, unspecified: Secondary | ICD-10-CM

## 2021-07-27 LAB — ECHOCARDIOGRAM COMPLETE
AR max vel: 2.18 cm2
AV Area VTI: 2.26 cm2
AV Area mean vel: 2.09 cm2
AV Mean grad: 8.7 mmHg
AV Peak grad: 15.5 mmHg
Ao pk vel: 1.97 m/s
Area-P 1/2: 4.32 cm2
S' Lateral: 2.4 cm

## 2021-08-03 ENCOUNTER — Encounter: Payer: Self-pay | Admitting: Internal Medicine

## 2021-08-03 ENCOUNTER — Ambulatory Visit (INDEPENDENT_AMBULATORY_CARE_PROVIDER_SITE_OTHER): Payer: 59 | Admitting: Internal Medicine

## 2021-08-03 VITALS — BP 130/88 | HR 79 | Ht 66.0 in | Wt 206.8 lb

## 2021-08-03 DIAGNOSIS — E785 Hyperlipidemia, unspecified: Secondary | ICD-10-CM | POA: Diagnosis not present

## 2021-08-03 DIAGNOSIS — E669 Obesity, unspecified: Secondary | ICD-10-CM

## 2021-08-03 DIAGNOSIS — E1165 Type 2 diabetes mellitus with hyperglycemia: Secondary | ICD-10-CM

## 2021-08-03 DIAGNOSIS — Z794 Long term (current) use of insulin: Secondary | ICD-10-CM

## 2021-08-03 NOTE — Progress Notes (Signed)
Patient ID: Destiny Munoz, female   DOB: [birth year], 66 y.o.   MRN: [id number]  ? ?This visit occurred during the SARS-CoV-2 public health emergency.  Safety protocols were in place, including screening questions prior to the visit, additional usage of staff PPE, and extensive cleaning of exam room while observing appropriate contact time as indicated for disinfecting solutions.  ? ?HPI: ?Destiny Munoz is a 66 y.o.-year-old female, presenting for follow-up for DM2, dx as GDM - 1995, then as DM2 in 2010, insulin-dependent since 05/2016, uncontrolled, without long-term complications. Last visit 4 months ago. ?She changed her insurance in 2021. ? ?Interim history: ?No increased urination, blurry vision, nausea, chest pain. ?She has mm spasms in neck - saw chiropractor in the past.  This resolved with heat. ? ?Reviewed HbA1c levels: ?07/13/2021: HbA1c 6.3% ?Lab Results  ?Component Value Date  ? HGBA1C 6.4 (A) 04/08/2021  ? HGBA1C 6.2 (A) 12/03/2020  ? HGBA1C 6.5 (A) 07/31/2020  ?07/14/2016: HbA1c 10.1% ?04/14/2016: HbA1c 11.3% ?01/13/2016: HbA1c 10.4% ?09/30/2015: HbA1c 10.9% ? ?She is on: ?- Tresiba 50 units daily ?- Glipizide 5 mg before larger dinner  - once a day ? - Farxiga 10 mg before breakfast ?- Ozempic 0.5 mg weekly - started 03/2020 >> 1 mg weekly ?She tried metformin R and ER, but it caused GI upset (N/D) ?Previously on glipizide ER 10 mg.   ?She was on Lantus 30 units at bedtime in the past. ?She was previously on NPH due to price. ? ?Pt checks her sugars twice a day: ?- am: 89-154, 164, 176 >> 77, 83-147, 156, 177 >> 91, 93-137, 150, 164 >> 92, 118-182 ?- 2h after b'fast: n/c ?- before lunch:  107-129 >> n/c ?- 2h after lunch: n/c ?- before dinner: 120-127 >> n/c >> 125-130 >> n/c ?- 2h after dinner: 148-240 (last 2 weeks: 169-185)  >> n/c ?- bedtime:  116, 138-207, 228, 257 >> 126-184, 192, 224 >> 106-210, 240>> 113-210, 226 ?- nighttime: n/c ?Lowest sugar was  77 >> 91 >> 92; she has  hypoglycemia awareness at 100. ?Highest sugar was 233 >> 224 >> 240 >> 226 ? ?Glucometer: [employer] ? ?Pt's meals are: ?- Breakfast: protein shake, cheese, sliced meat, toast ?- break: yoghurt ?- Lunch: sandwich or hamburger ?- break: banana ?- Dinner: chicken/steak + salad + starch ?- Dessert: cookies ?She does not like vegetables and fruits. ? ?-No CKD, last BUN/creatinine: ?07/13/2021: 11/0.81, GFR 81, glucose 105, ACR <11 ?06/05/2020: 13/0.81, glucose 98, ACR <20 ?07/23/2019: BUN/Cr 21/0.71 ?10/04/2016: BUN/Cr 10/0.76, Glu 277 ?07/14/2016: BUN/Cr 10/0.67, Glu 213 ?01/13/2016: UACR <0.7 ?No results found for: BUN, CREATININE  ?On lisinopril. ? ?-+ HL; last set of lipids: ?07/13/2021: 174/85/55/103 ?06/05/2020: 165/82/53/97 ?07/23/2019: 169/104/53/97 ?10/04/2016: 178/90/42/117 ?09/30/2015: 207/110/49/136 ?No results found for: CHOL, HDL, LDLCALC, LDLDIRECT, TRIG, CHOLHDL  ?On pravastatin 20 >> 40 mg daily ? ?- last eye exam was in 04/2021: No DR reportedly ? ?- she denies numbness and tingling in her feet.  + Callus.  She sees podiatry.  Last foot exam 03/2021. ? ?ROS: ?+ SEE hpi ? ?I reviewed pt's medications, allergies, PMH, social hx, family hx, and changes were documented in the history of present illness. Otherwise, unchanged from my initial visit note. ? ?PMH: ?- DM2 ?- HTN ?- HL ?- Depression ? ?No past surgical history on file.  ? ?Social History  ? ?Social History  ? Marital status: single  ?  Spouse name: N/A  ? Number of children: 5  ? 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 ?Social History Main Topics  ? Smoking status: No  ? Smokeless tobacco: No  ? Alcohol use Wine, 2x weekly  ? Drug use: No  ? ?Current Outpatient Medications  ?Medication Sig Dispense Refill  ? FARXIGA 10 MG TABS tablet TAKE ONE TABLET BY MOUTH DAILY BEFORE BREAKFAST 90 tablet 3  ? glipiZIDE (GLUCOTROL) 5 MG tablet Take 1 tablet (5 mg total) by mouth daily before supper. 90 tablet 3  ? insulin degludec (TRESIBA  FLEXTOUCH) 200 UNIT/ML FlexTouch Pen Inject 50 Units into the skin daily. 18 mL 3  ? Insulin Pen Needle 32G X 4 MM MISC Use 1x a day 100 each 3  ? lisinopril (PRINIVIL,ZESTRIL) 10 MG tablet Take 10 mg by mouth daily.    ? pravastatin (PRAVACHOL) 10 MG tablet Take 20 mg by mouth daily.     ? Semaglutide, 1 MG/DOSE, (OZEMPIC, 1 MG/DOSE,) 4 MG/3ML SOPN Inject 1 mg into the skin once a week. 9 mL 3  ? ?No current facility-administered medications for this visit.  ? ?No Known Allergies  ? ?Family History  ?Problem Relation Age of Onset  ? Diabetes Mother   ? Hypertension Mother   ? Hyperlipidemia Mother   ? Heart disease Mother   ? Hypertension Father   ? Hyperlipidemia Father   ? Heart disease Father   ? Cancer Father   ? Cancer Sister   ? Breast cancer Sister 70  ? ?Pt has FH of DM in mother. ? ?PE: ?BP 130/88 (BP Location: Right Arm, Patient Position: Sitting, Cuff Size: Normal)   Pulse 79   Ht 5\' 6"  (1.676 m)   Wt 206 lb 12.8 oz (93.8 kg)   SpO2 95%   BMI 33.38 kg/m?  ?Wt Readings from Last 3 Encounters:  ?08/03/21 206 lb 12.8 oz (93.8 kg)  ?04/08/21 205 lb 12.8 oz (93.4 kg)  ?12/03/20 204 lb 6.4 oz (92.7 kg)  ? ?Constitutional: overweight, in NAD ?Eyes: PERRLA, EOMI, no exophthalmos ?ENT: moist mucous membranes, no thyromegaly, no cervical lymphadenopathy ?Cardiovascular: tachycardia, RR, no MRG ?Respiratory: CTA B ?Musculoskeletal: no deformities, strength intact in all 4 ?Skin: moist, warm, no rashes ?Neurological: no tremor with outstretched hands, DTR normal in all 4 ? ?ASSESSMENT: ?1. DM2, insulin-dependent, uncontrolled, without long term complications, but with hyperglycemia ? ?2. HL ? ?3.  Obesity class I ? ?PLAN:  ?1. Patient with longstanding, uncontrolled, type 2 diabetes, with improved control after she was able to restart Ozempic.  At last visit, sugars were closer to goal in the morning with only occasional mildly hyperglycemic spikes but she still had some high blood sugars at bedtime.  She was  still taking glipizide incorrectly and I again advised her to take the glipizide 20 to 30 minutes before a larger dinner, and not at bedtime.  I advised her to vary the dose of glipizide based on the size of her meal.  HbA1c at last visit was higher, at 6.4%.  However, last month she had another HbA1c obtained by PCP and this was slightly lower, at 6.3%. ?-At today's visit, the sugars appear to be higher in the last approximately 2 weeks.  She cannot identify any culprits.  We discussed about looking at the dinners before high blood sugars at night and in the morning and trying to eliminate foods that increase her blood sugars.  Also, we discussed that with some meals, she definitely needs to take glipizide 30 minutes before.  Otherwise, I  do not feel that she needs a change in regimen. ?- I suggested to:  ?Patient Instructions  ?Please continue: ?- Farxiga 10 mg before b'fast ?- Glipizide 2.5-5 mg as needed 20-30 min before a larger dinner  ?- Tresiba 50 units daily  ?- Ozempic 1 mg weekly ? ?Please return in 4 months with your sugar log  ? ?- advised to check sugars at different times of the day - 2x a day, rotating check times ?- advised for yearly eye exams >> she is UTD ?- return to clinic in 4 months ? ?2. HL ?-Reviewed latest lipid panel from 06/2021: Fractions at goal with the exception of a high LDL. ?-Continues pravastatin 40 mg daily without side effects ? ?3.  Obesity class I ?-continue SGLT 2 inhibitor and GLP-1 receptor agonist which should also help with weight loss ?-Weight was approximately stable at last visit, she previously lost approximately 10 pounds ?-She gained 1 pound since last visit. ? ?Philemon Kingdom, MD PhD ?[facility] ? ? ?

## 2021-08-03 NOTE — Patient Instructions (Signed)
Please continue: ?- Farxiga 10 mg before b'fast ?- Glipizide 2.5-5 mg as needed 20-30 min before a larger dinner  ?- Tresiba 50 units daily  ?- Ozempic 1 mg weekly ? ?Please return in 4 months with your sugar log  ?

## 2021-08-06 ENCOUNTER — Other Ambulatory Visit: Payer: Self-pay | Admitting: Internal Medicine

## 2021-08-06 DIAGNOSIS — E1165 Type 2 diabetes mellitus with hyperglycemia: Secondary | ICD-10-CM

## 2021-09-29 IMAGING — MG DIGITAL DIAGNOSTIC BILAT W/ TOMO W/ CAD
8 series · 8 of 24 positions shown · non-contrast
Comparison: Previous exam(s).

CLINICAL DATA: Follow-up 2 probably benign left breast masses.

EXAM:
DIGITAL DIAGNOSTIC BILATERAL MAMMOGRAM WITH TOMOSYNTHESIS AND CAD;
ULTRASOUND LEFT BREAST LIMITED
TECHNIQUE: Bilateral digital diagnostic mammography and breast tomosynthesis
was performed. The images were evaluated with computer-aided
detection.; Targeted ultrasound examination of the left breast was
performed

[R CC synth-2D]
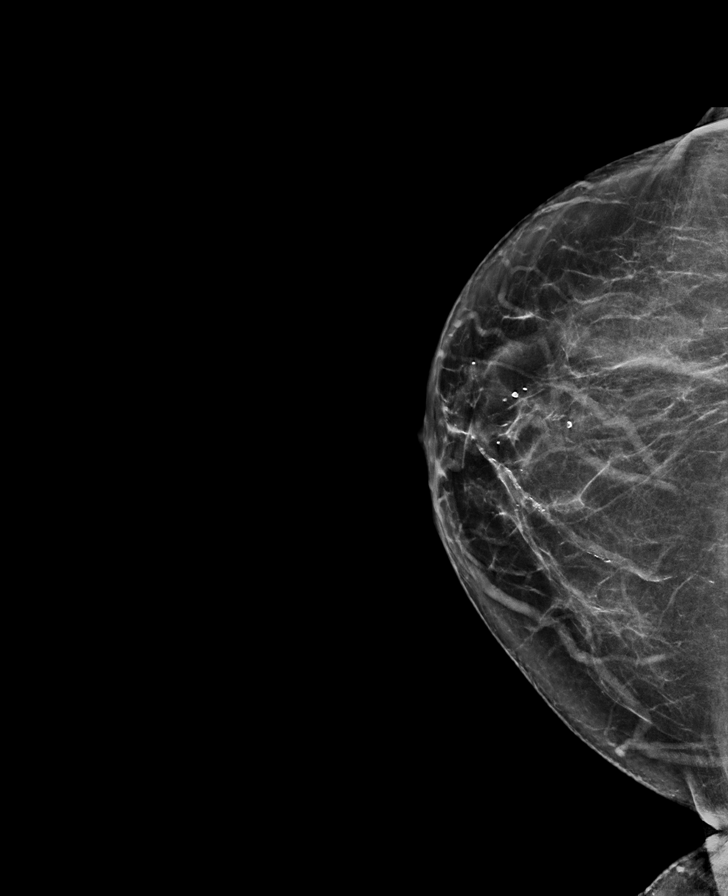

[L CC synth-2D]
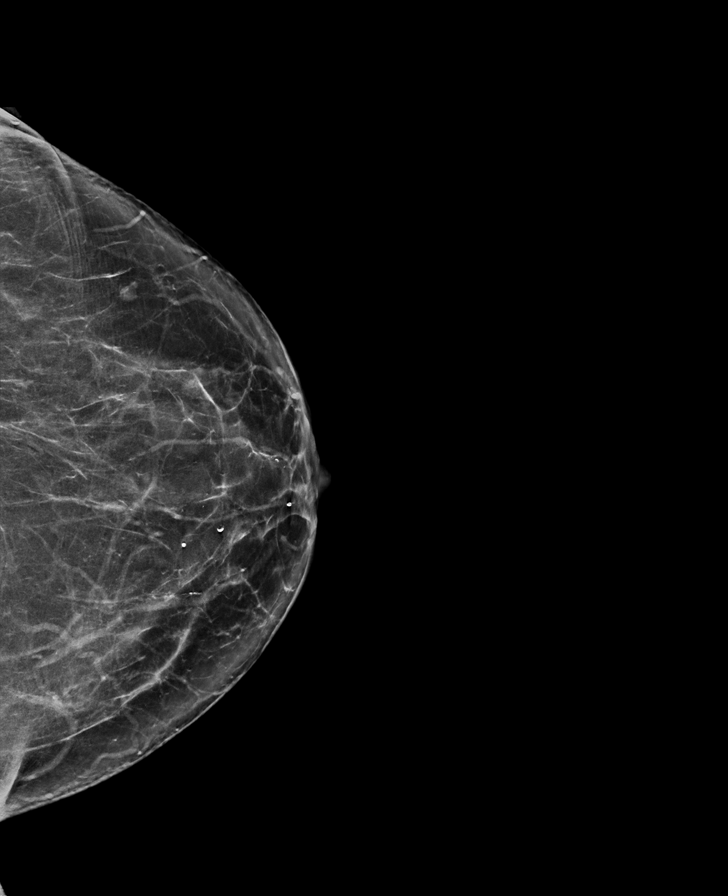

[R MLO synth-2D]
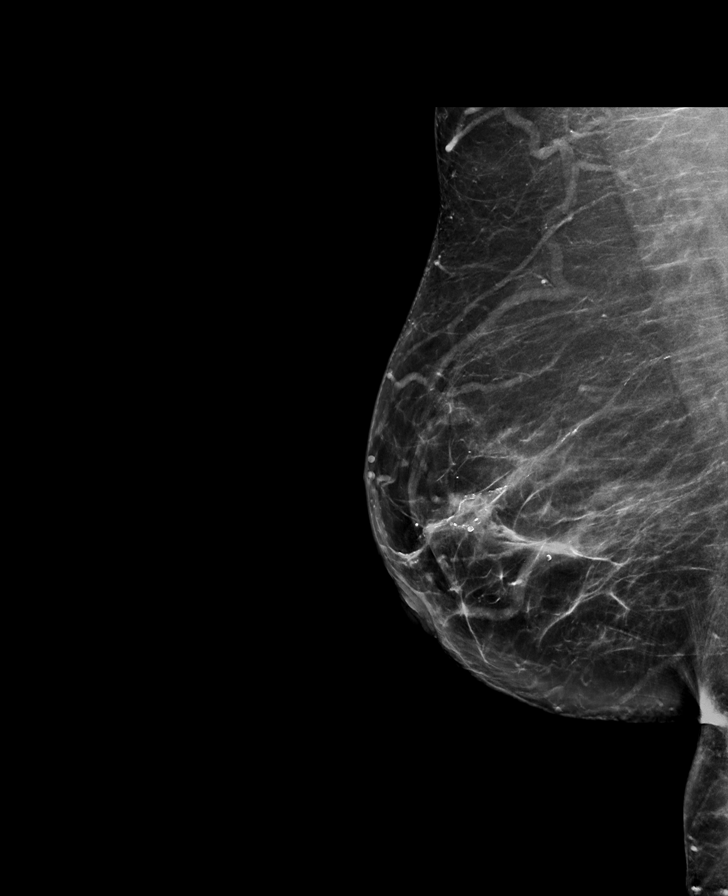

[L MLO synth-2D]
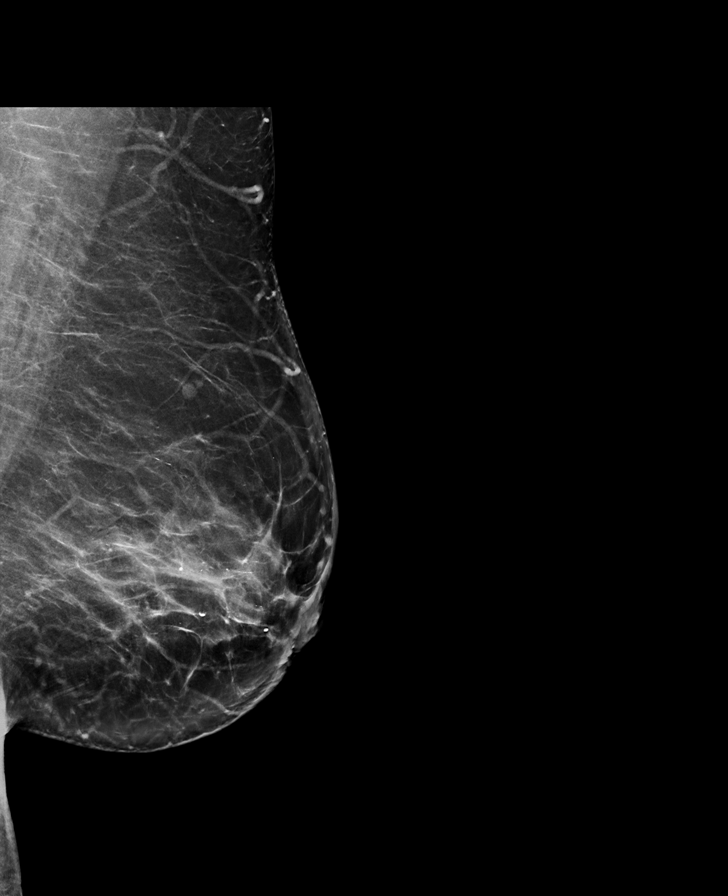

[R MLO tomo · tomo slice 41/80.0]
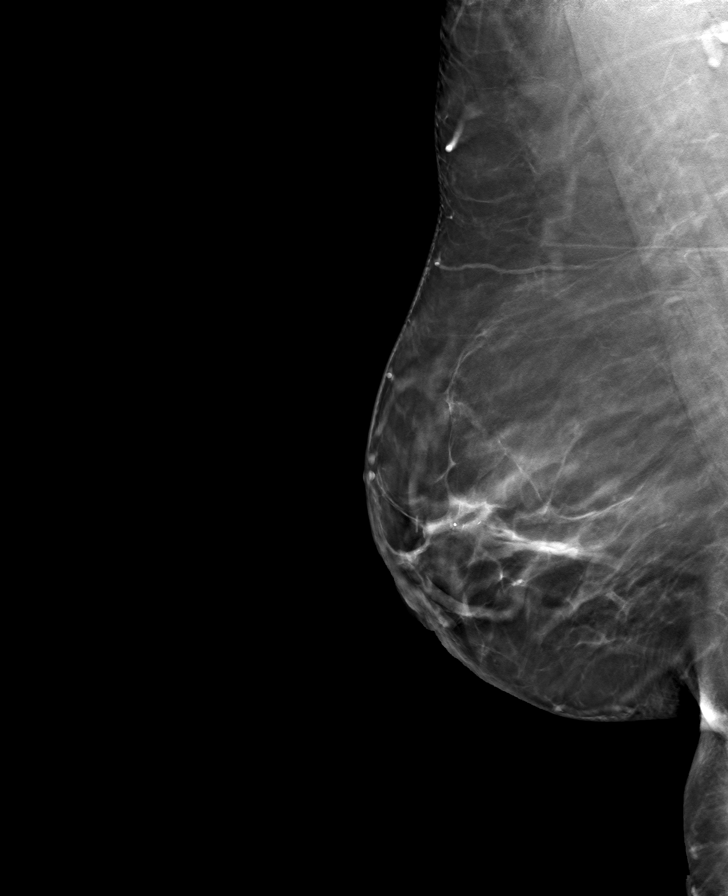

[L MLO tomo · tomo slice 40/79.0]
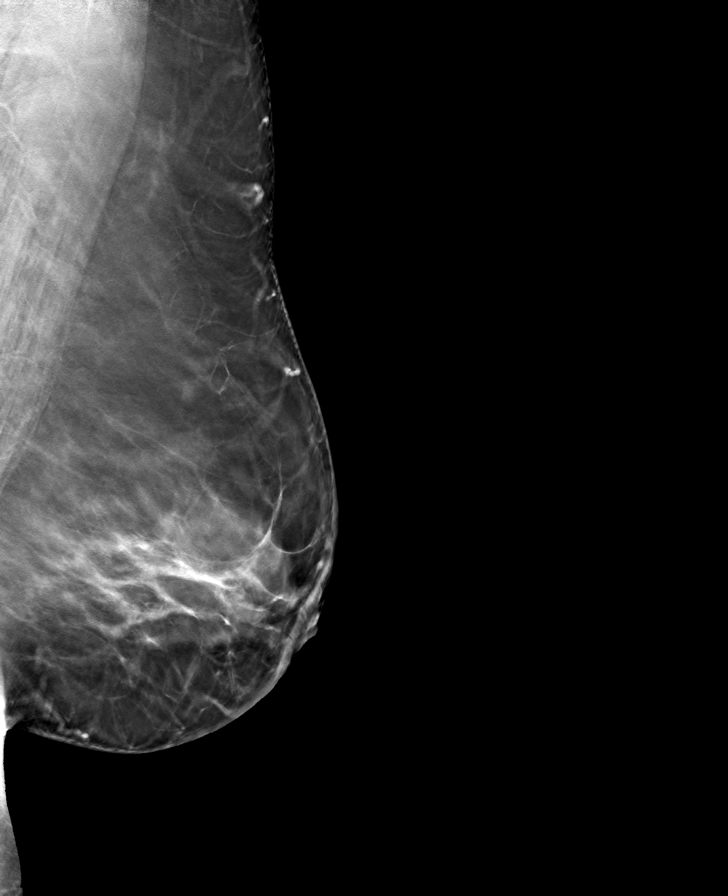

[L CC tomo · tomo slice 35/69.0]
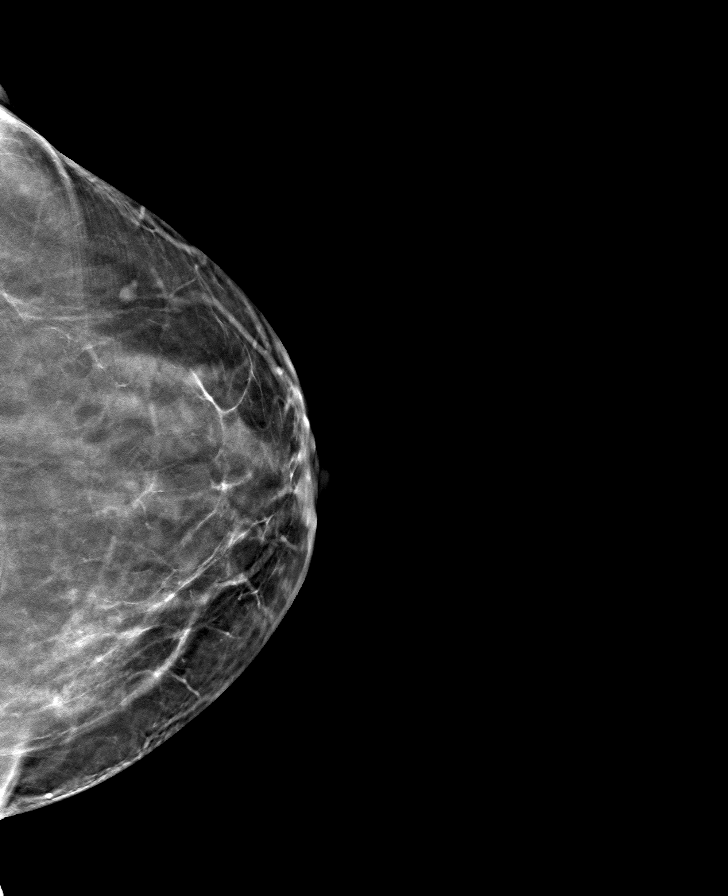

[R CC tomo · tomo slice 36/71.0]
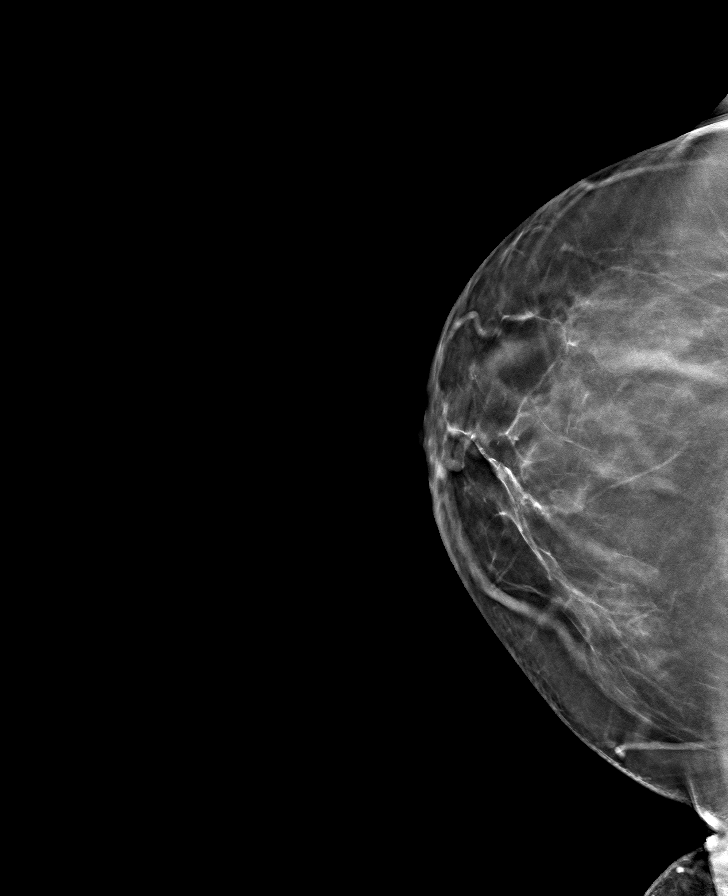

[8 of 24 positions shown; findings below may reference images not displayed]

ACR Breast Density Category b: There are scattered areas of
fibroglandular density.
FINDINGS: The 2 adjacent masses in the lateral left breast are stable
mammographically. No other suspicious findings are seen in either
breast.

On physical exam, no suspicious lumps are identified.

Targeted ultrasound is performed, showing no interval change in the
2 masses at [DATE], 5 cm from the nipple in the left breast. This
documents 2 years of stability.
IMPRESSION: The 2 masses in the lateral left breast are now considered benign
given 2 years of stability. No other abnormality seen on today's
imaging.

RECOMMENDATION:
Annual screening mammography.

I have discussed the findings and recommendations with the patient.
If applicable, a reminder letter will be sent to the patient
regarding the next appointment.

BI-RADS CATEGORY  2: Benign.

## 2021-11-20 ENCOUNTER — Other Ambulatory Visit: Payer: Self-pay | Admitting: Internal Medicine

## 2021-11-20 DIAGNOSIS — E1165 Type 2 diabetes mellitus with hyperglycemia: Secondary | ICD-10-CM

## 2021-11-23 ENCOUNTER — Other Ambulatory Visit: Payer: Self-pay | Admitting: Internal Medicine

## 2021-12-09 ENCOUNTER — Encounter: Payer: Self-pay | Admitting: Internal Medicine

## 2021-12-09 ENCOUNTER — Ambulatory Visit (INDEPENDENT_AMBULATORY_CARE_PROVIDER_SITE_OTHER): Payer: 59 | Admitting: Internal Medicine

## 2021-12-09 VITALS — BP 120/60 | HR 82 | Ht 66.0 in | Wt 206.6 lb

## 2021-12-09 DIAGNOSIS — E669 Obesity, unspecified: Secondary | ICD-10-CM

## 2021-12-09 DIAGNOSIS — E1165 Type 2 diabetes mellitus with hyperglycemia: Secondary | ICD-10-CM

## 2021-12-09 DIAGNOSIS — Z794 Long term (current) use of insulin: Secondary | ICD-10-CM | POA: Diagnosis not present

## 2021-12-09 DIAGNOSIS — E785 Hyperlipidemia, unspecified: Secondary | ICD-10-CM | POA: Diagnosis not present

## 2021-12-09 DIAGNOSIS — E66811 Obesity, class 1: Secondary | ICD-10-CM

## 2021-12-09 LAB — POCT GLYCOSYLATED HEMOGLOBIN (HGB A1C): Hemoglobin A1C: 6.3 % — AB (ref 4.0–5.6)

## 2021-12-09 MED ORDER — GLIPIZIDE 5 MG PO TABS: 5.0000 mg | ORAL_TABLET | Freq: Every day | ORAL | 3 refills | Status: AC

## 2021-12-09 MED ORDER — TRESIBA FLEXTOUCH 200 UNIT/ML ~~LOC~~ SOPN: 50.0000 [IU] | PEN_INJECTOR | Freq: Every day | SUBCUTANEOUS | 3 refills | Status: DC

## 2021-12-09 MED ORDER — DAPAGLIFLOZIN PROPANEDIOL 10 MG PO TABS: ORAL_TABLET | ORAL | 3 refills | Status: DC

## 2021-12-09 NOTE — Patient Instructions (Signed)
Please continue: - Farxiga 10 mg before b'fast - Glipizide 2.5-5 mg as needed 20-30 min before a larger dinner  - Tresiba 50 units daily  - Ozempic 1 mg weekly  Please return in 4-6 months with your sugar log

## 2021-12-09 NOTE — Progress Notes (Signed)
Patient ID: Destiny Munoz, female   DOB: 25-Aug-1955, 66 y.o.   MRN: [id number]   HPI: Destiny Munoz is a 66 y.o.-year-old female, presenting for follow-up for DM2, dx as GDM - 1995, then as DM2 in 2010, insulin-dependent since 05/2016, uncontrolled, without long-term complications. Last visit 4 months ago. She changed her insurance in 2021.  Interim history: No increased urination, blurry vision, nausea, chest pain. She went to [place] this summer with 3 of her 5 sons and her new granddaughter (58 months old).  Reviewed HbA1c levels: 07/13/2021: HbA1c 6.3% Lab Results  Component Value Date   HGBA1C 6.4 (A) 04/08/2021   HGBA1C 6.2 (A) 12/03/2020   HGBA1C 6.5 (A) 07/31/2020  07/14/2016: HbA1c 10.1% 04/14/2016: HbA1c 11.3% 01/13/2016: HbA1c 10.4% 09/30/2015: HbA1c 10.9%  She is on: - Tresiba 50 units daily - Glipizide 5 mg before larger dinner >> occasionally  - Farxiga 10 mg before breakfast - Ozempic 0.5 mg weekly - started 03/2020 >> 1 mg weekly She tried metformin R and ER, but it caused GI upset (N/D) Previously on glipizide ER 10 mg.   She was on Lantus 30 units at bedtime in the past. She was previously on NPH due to price.  Pt checks her sugars twice a day: - am: 91, 93-137, 150, 164 >> 92, 118-182 >> 97, 110-148, 161 - 2h after b'fast: n/c - before lunch:  107-129 >> n/c - 2h after lunch: n/c - before dinner: 120-127 >> n/c >> 125-130 >> n/c - 2h after dinner: 148-240  >> n/c - bedtime: 106-210, 240 >> 113-210, 226 >> 118-169, 191, 240 - nighttime: n/c Lowest sugar was  77 >> 91 >> 92 >> 97; she has hypoglycemia awareness at 100. Highest sugar was 240 >> 226 >> off [place] for 4 days: 240.  Glucometer: [employer]  Pt's meals are: - Breakfast: protein shake, cheese, sliced meat, toast - break: yoghurt - Lunch: sandwich or hamburger - break: banana - Dinner: chicken/steak + salad + starch - Dessert: cookies She does not like vegetables and  fruits.  -No CKD, last BUN/creatinine: 07/13/2021: 11/0.81, GFR 81, glucose 105, ACR <11 06/05/2020: 13/0.81, glucose 98, ACR <20 07/23/2019: BUN/Cr 21/0.71 10/04/2016: BUN/Cr 10/0.76, Glu 277 07/14/2016: BUN/Cr 10/0.67, Glu 213 01/13/2016: UACR <0.7 No results found for: "BUN", "CREATININE"  On lisinopril.  -+ HL; last set of lipids: 07/13/2021: 174/85/55/103 06/05/2020: 165/82/53/97 07/23/2019: 169/104/53/97 10/04/2016: 178/90/42/117 09/30/2015: 207/110/49/136 No results found for: "CHOL", "HDL", "LDLCALC", "LDLDIRECT", "TRIG", "CHOLHDL"  On pravastatin 20 >> 40 mg daily  - last eye exam was in 04/2021: No DR reportedly  - she denies numbness and tingling in her feet.  + Callus.  She sees podiatry.  Last foot exam 03/2021.  She had mm spasms in neck - saw chiropractor in the past.  These resolved with heat.  ROS: + SEE hpi  I reviewed pt's medications, allergies, PMH, social hx, family hx, and changes were documented in the history of present illness. Otherwise, unchanged from my initial visit note.  PMH: - DM2 - HTN - HL - Depression  No past surgical history on file.   Social History   Social History   Marital status: single    Spouse name: N/A   Number of children: 5   Occupational History   Production manager   Social History Main Topics   Smoking status: No   Smokeless tobacco: No   Alcohol use Wine, 2x weekly   Drug use: No  Current Outpatient Medications  Medication Sig Dispense Refill   FARXIGA 10 MG TABS tablet TAKE ONE TABLET BY MOUTH DAILY BEFORE BREAKFAST 90 tablet 3   glipiZIDE (GLUCOTROL) 5 MG tablet Take 1 tablet (5 mg total) by mouth daily before supper. 90 tablet 3   Insulin Pen Needle 32G X 4 MM MISC Use 1x a day 100 each 3   lisinopril (PRINIVIL,ZESTRIL) 10 MG tablet Take 10 mg by mouth daily.     OZEMPIC, 1 MG/DOSE, 4 MG/3ML SOPN DIAL AND INJECT UNDER THE SKIN 1 MG WEEKLY 3 mL 3   pravastatin (PRAVACHOL) 10 MG tablet Take  20 mg by mouth daily.      TRESIBA FLEXTOUCH 200 UNIT/ML FlexTouch Pen INJECT 50 UNITS INTO THE SKIN DAILY 6 mL 0   No current facility-administered medications for this visit.   No Known Allergies   Family History  Problem Relation Age of Onset   Diabetes Mother    Hypertension Mother    Hyperlipidemia Mother    Heart disease Mother    Hypertension Father    Hyperlipidemia Father    Heart disease Father    Cancer Father    Cancer Sister    Breast cancer Sister 57   Pt has FH of DM in mother.  PE: BP 120/60 (BP Location: Right Arm, Patient Position: Sitting, Cuff Size: Normal)   Pulse 82   Ht 5\' 6"  (1.676 m)   Wt 206 lb 9.6 oz (93.7 kg)   SpO2 96%   BMI 33.35 kg/m  Wt Readings from Last 3 Encounters:  12/09/21 206 lb 9.6 oz (93.7 kg)  08/03/21 206 lb 12.8 oz (93.8 kg)  04/08/21 205 lb 12.8 oz (93.4 kg)   Constitutional: overweight, in NAD Eyes:  EOMI, no exophthalmos ENT: no neck masses, no cervical lymphadenopathy Cardiovascular: RRR, No MRG Respiratory: CTA B Musculoskeletal: no deformities Skin:no rashes Neurological: no tremor with outstretched hands  ASSESSMENT: 1. DM2, insulin-dependent, uncontrolled, without long term complications, but with hyperglycemia  2. HL  3.  Obesity class I  PLAN:  1. Patient with longstanding, previously uncontrolled type 2 diabetes, with improved control after she was able to restart Ozempic.  At last visit, HbA1c was 6.3% and sugars appear to be higher in the previous 2 weeks without any identified culprits.  We discussed about looking at the dinners before high blood sugars at night and in the morning and try to eliminate foods that increase her blood sugars.  We discussed about taking glipizide 30 minutes before larger meals.  Otherwise, we did not change her regimen. -At today's visit, sugars are mostly at goal, per review of her log, they appear to be improved from last visit.  However, she had a period of time of 4 days in  which she was off [place] and sugars increase to 200s towards the end of the day and they were also high in the morning.  Currently back on the recommended dose.  She has occasional higher blood sugars after certain dinners and I did encourage her to take the glipizide before these.  As of now, she is only rarely taking it.  Otherwise, no need to change her regimen for now. -She has occasional - I suggested to:  Patient Instructions  Please continue: - Farxiga 10 mg before b'fast - Glipizide 2.5-5 mg as needed 20-30 min before a larger dinner  - Tresiba 50 units daily  - Ozempic 1 mg weekly  Please return in 4-6 months with your  sugar log   - we checked her HbA1c: 6.3% (stable) - advised to check sugars at different times of the day - 1x a day, rotating check times - advised for yearly eye exams >> she is UTD - return to clinic in 4-6 months  2. HL -Viewed latest lipid panel from 06/2021: Fractions at goal with the exception of a high LDL -She continues on pravastatin 40 mg daily without side effects  3.  Obesity class I -continue SGLT 2 inhibitor and GLP-1 receptor agonist which should also help with weight loss -At last visit weight was approximately stable, she previously lost 10 pounds -Weight is stable at this visit  Philemon Kingdom, MD PhD [facility] End[facility]

## 2022-03-31 ENCOUNTER — Other Ambulatory Visit: Payer: Self-pay | Admitting: Internal Medicine

## 2022-06-06 ENCOUNTER — Ambulatory Visit: Payer: 59 | Admitting: Internal Medicine

## 2022-06-07 ENCOUNTER — Encounter: Payer: Self-pay | Admitting: Internal Medicine

## 2022-06-07 ENCOUNTER — Ambulatory Visit (INDEPENDENT_AMBULATORY_CARE_PROVIDER_SITE_OTHER): Payer: 59 | Admitting: Internal Medicine

## 2022-06-07 VITALS — BP 136/82 | HR 94 | Ht 66.0 in | Wt 208.0 lb

## 2022-06-07 DIAGNOSIS — Z794 Long term (current) use of insulin: Secondary | ICD-10-CM

## 2022-06-07 DIAGNOSIS — E785 Hyperlipidemia, unspecified: Secondary | ICD-10-CM

## 2022-06-07 DIAGNOSIS — E669 Obesity, unspecified: Secondary | ICD-10-CM | POA: Diagnosis not present

## 2022-06-07 DIAGNOSIS — E1165 Type 2 diabetes mellitus with hyperglycemia: Secondary | ICD-10-CM | POA: Diagnosis not present

## 2022-06-07 LAB — POCT GLYCOSYLATED HEMOGLOBIN (HGB A1C): Hemoglobin A1C: 6.5 % — AB (ref 4.0–5.6)

## 2022-06-07 NOTE — Progress Notes (Signed)
Patient ID: Destiny Munoz, female   DOB: 12-01-1955, 67 y.o.   MRN: [id number]   HPI: Destiny Munoz is a 67 y.o.-year-old female, presenting for follow-up for DM2, dx as GDM - 1995, then as DM2 in 2010, insulin-dependent since 05/2016, uncontrolled, without long-term complications. Last visit 6 months ago. She changed her insurance in 2021.  Interim history: No increased urination, blurry vision, nausea, chest pain. She will go to [place] and [place] next month.  Reviewed HbA1c levels: 04/05/2022: HbA1c 6.3% 07/13/2021: HbA1c 6.3% Lab Results  Component Value Date   HGBA1C 6.3 (A) 12/09/2021   HGBA1C 6.4 (A) 04/08/2021   HGBA1C 6.2 (A) 12/03/2020  07/14/2016: HbA1c 10.1% 04/14/2016: HbA1c 11.3% 01/13/2016: HbA1c 10.4% 09/30/2015: HbA1c 10.9%  She is on: - Tresiba 50 units daily - Glipizide 5 mg before larger dinner >> occasionally  - Farxiga 10 mg before breakfast - Ozempic 0.5 mg weekly - started 03/2020 >> 1 mg weekly She tried metformin R and ER, but it caused GI upset (N/D) Previously on glipizide ER 10 mg.   She was on Lantus 30 units at bedtime in the past. She was previously on NPH due to price.  Pt checks her sugars twice a day: - am:  92, 118-182 >> 97, 110-148, 161 >> 107-140 - 2h after b'fast: n/c - before lunch:  107-129 >> n/c - 2h after lunch: n/c - before dinner: 120-127 >> n/c >> 125-130 >> n/c - 2h after dinner: 148-240  >> n/c - bedtime: 113-210, 226 >> 118-169, 191, 240 >> 130-160 - nighttime: n/c Lowest sugar was  77 >> 91 >> 92 >> 97 >> 89; she has hypoglycemia awareness at 100. Highest sugar was 240 >> 226 >> off Tresiba for 4 days: 240 >> 208.  Glucometer: [employer]  Pt's meals are: - Breakfast: protein shake, cheese, sliced meat, toast - break: yoghurt - Lunch: sandwich or hamburger - break: banana - Dinner: chicken/steak + salad + starch - Dessert: cookies She does not like vegetables and fruits.  -No CKD, last  BUN/creatinine: Component Value Reference Range  Comp Metabolic Panel Reviewed [id number] 05:03:16 PM I  Glucose 108 70-99 mg/dL  BUN 13 6-26 mg/dL  Creatinine 0.82 0.60-1.30 mg/dl  [id number] 80 >60 calc  Sodium 141 136-145 mmol/L  Potassium 4.4 3.5-5.5 mmol/L  Chloride 105 98-107 mmol/L  CO2 29 22-32 mmol/L  Anion Gap 11.5 6.0-20.0 mmol/L  Calcium 9.5 8.6-10.3 mg/dL  CA-corrected 9.59 8.60-10.30 mg/dL  Protein, Total 6.6 6.0-8.3 g/dL  Albumin 3.9 3.4-4.8 g/dL  TBIL 0.4 0.3-1.0 mg/dL  ALP 55 38-126 U/L  AST 15 0-39 U/L  ALT 15 0-52 U/L  01/17/2022: ACR <11 07/13/2021: 11/0.81, GFR 81, glucose 105, ACR <11 06/05/2020: 13/0.81, glucose 98, ACR <20 07/23/2019: BUN/Cr 21/0.71 10/04/2016: BUN/Cr 10/0.76, Glu 277 07/14/2016: BUN/Cr 10/0.67, Glu 213 01/13/2016: UACR <0.7 No results found for: "BUN", "CREATININE"  On lisinopril.  -+ HL; last set of lipids: 07/13/2021: 174/85/55/103 06/05/2020: 165/82/53/97 07/23/2019: 169/104/53/97 10/04/2016: 178/90/42/117 09/30/2015: 207/110/49/136 No results found for: "CHOL", "HDL", "LDLCALC", "LDLDIRECT", "TRIG", "CHOLHDL"  On pravastatin 20 >> 40 mg daily  - last eye exam was in 04/2021: No DR reportedly  - she denies numbness and tingling in her feet.  + Callus.  She sees podiatry.  Last foot exam 03/2021.  She had mm spasms in neck - saw chiropractor in the past.  These resolved with heat.  ROS: + SEE hpi  I reviewed pt's medications, allergies, PMH, social hx, family hx, and  changes were documented in the history of present illness. Otherwise, unchanged from my initial visit note.  PMH: - DM2 - HTN - HL - Depression  No past surgical history on file.   Social History   Social History   Marital status: single    Spouse name: N/A   Number of children: 5   Occupational History   Production manager   Social History Main Topics   Smoking status: No   Smokeless tobacco: No   Alcohol use Wine, 2x weekly    Drug use: No   Current Outpatient Medications  Medication Sig Dispense Refill   dapagliflozin propanediol (FARXIGA) 10 MG TABS tablet TAKE ONE TABLET BY MOUTH DAILY BEFORE BREAKFAST 90 tablet 3   glipiZIDE (GLUCOTROL) 5 MG tablet Take 1 tablet (5 mg total) by mouth daily before supper. 90 tablet 3   insulin degludec (TRESIBA FLEXTOUCH) 200 UNIT/ML FlexTouch Pen Inject 50 Units into the skin daily. 18 mL 3   Insulin Pen Needle 32G X 4 MM MISC Use 1x a day 100 each 3   lisinopril (PRINIVIL,ZESTRIL) 10 MG tablet Take 10 mg by mouth daily.     OZEMPIC, 1 MG/DOSE, 4 MG/3ML SOPN DIAL AND INJECT UNDER THE SKIN 1 MG WEEKLY 3 mL 3   pravastatin (PRAVACHOL) 10 MG tablet Take 20 mg by mouth daily.      No current facility-administered medications for this visit.   No Known Allergies   Family History  Problem Relation Age of Onset   Diabetes Mother    Hypertension Mother    Hyperlipidemia Mother    Heart disease Mother    Hypertension Father    Hyperlipidemia Father    Heart disease Father    Cancer Father    Cancer Sister    Breast cancer Sister 63   Pt has FH of DM in mother.  PE: BP 136/82 (BP Location: Right Arm, Patient Position: Sitting, Cuff Size: Normal)   Pulse 94   Ht 5\' 6"  (1.676 m)   Wt 208 lb (94.3 kg)   SpO2 97%   BMI 33.57 kg/m  Wt Readings from Last 3 Encounters:  06/07/22 208 lb (94.3 kg)  12/09/21 206 lb 9.6 oz (93.7 kg)  08/03/21 206 lb 12.8 oz (93.8 kg)   Constitutional: overweight, in NAD Eyes:  EOMI, no exophthalmos ENT: no neck masses, no cervical lymphadenopathy Cardiovascular: RRR, No MRG Respiratory: CTA B Musculoskeletal: no deformities Skin:no rashes Neurological: no tremor with outstretched hands Diabetic Foot Exam - Simple   Simple Foot Form Diabetic Foot exam was performed with the following findings: Yes 06/07/2022  8:33 AM  Visual Inspection No deformities, no ulcerations, no other skin breakdown bilaterally: Yes Sensation  Testing Intact to touch and monofilament testing bilaterally: Yes Pulse Check Posterior Tibialis and Dorsalis pulse intact bilaterally: Yes Comments + medial callus    ASSESSMENT: 1. DM2, insulin-dependent, uncontrolled, without long term complications, but with hyperglycemia  2. HL  3.  Obesity class I  PLAN:  1. Patient with longstanding, previously uncontrolled type 2 diabetes, with improvement in control after she was able to restart Ozempic.  At last visit, HbA1c was stable, at goal, at 6.3%.  Sugars were mostly at goal per review of her log and appears to be improved from the previous visit.  She had a period of 4 days in which she was off [place] and sugars were in the 200s towards the end of the day and they were also high in the  morning.  She was able to restart and sugars improved.  She had occasional higher blood sugars after certain dinners and I encouraged her to use glipizide before such meals.  Otherwise we did not change her regimen. -She had another HbA1c 2 months ago and this was stable, at 6.3% -At today's visit, sugars remain at goal with only occasional slightly higher sugars in the morning if she has a late dinner.  We discussed about using glipizide in the situations.  She most frequently has larger lunches and we discussed that she could use glipizide before this meal, also.  Otherwise, I do not feel we need to change her regimen. - I suggested to:  Patient Instructions  Please continue: - Farxiga 10 mg before b'fast - Glipizide 2.5-5 mg as needed 20-30 min before a larger meal - Tresiba 50 units daily  - Ozempic 1 mg weekly  Please return in 4-6 months with your sugar log   - we checked her HbA1c: 6.5% (slightly higher) - advised to check sugars at different times of the day - 1x a day, rotating check times - advised for yearly eye exams >> she is UTD - return to clinic in 4-6 months  2. HL -Reviewed latest lipid panel from 06/2021: Fractions at goal with  the exception of a high LDL -She continues on pravastatin 40 mg daily without side effects  3.  Obesity class I -continue SGLT 2 inhibitor and GLP-1 receptor agonist which should also help with weight loss -Weight was stable at last visit and gained 2 pounds since then  Philemon Kingdom, MD PhD [facility] Endocrinology

## 2022-06-07 NOTE — Patient Instructions (Addendum)
Please continue: - Farxiga 10 mg before b'fast - Glipizide 2.5-5 mg as needed 20-30 min before a larger dinner  - Tresiba 50 units daily  - Ozempic 1 mg weekly  Please return in 4-6 months with your sugar log

## 2022-07-23 ENCOUNTER — Other Ambulatory Visit: Payer: Self-pay | Admitting: Internal Medicine

## 2022-09-23 ENCOUNTER — Other Ambulatory Visit: Payer: Self-pay | Admitting: Family Medicine

## 2022-09-23 DIAGNOSIS — Z1231 Encounter for screening mammogram for malignant neoplasm of breast: Secondary | ICD-10-CM

## 2022-09-28 ENCOUNTER — Ambulatory Visit: Payer: 59

## 2022-09-28 DIAGNOSIS — Z1231 Encounter for screening mammogram for malignant neoplasm of breast: Secondary | ICD-10-CM

## 2022-10-11 LAB — HM DIABETES EYE EXAM

## 2022-10-28 ENCOUNTER — Other Ambulatory Visit: Payer: Self-pay | Admitting: Internal Medicine

## 2022-10-28 DIAGNOSIS — E1165 Type 2 diabetes mellitus with hyperglycemia: Secondary | ICD-10-CM

## 2022-11-13 ENCOUNTER — Other Ambulatory Visit: Payer: Self-pay | Admitting: Internal Medicine

## 2022-12-07 ENCOUNTER — Ambulatory Visit: Payer: BC Managed Care – PPO | Admitting: Internal Medicine

## 2022-12-07 ENCOUNTER — Encounter: Payer: Self-pay | Admitting: Internal Medicine

## 2022-12-07 VITALS — BP 124/80 | HR 80 | Resp 18 | Ht 66.0 in | Wt 208.2 lb

## 2022-12-07 DIAGNOSIS — E785 Hyperlipidemia, unspecified: Secondary | ICD-10-CM | POA: Diagnosis not present

## 2022-12-07 DIAGNOSIS — E1165 Type 2 diabetes mellitus with hyperglycemia: Secondary | ICD-10-CM

## 2022-12-07 DIAGNOSIS — Z794 Long term (current) use of insulin: Secondary | ICD-10-CM | POA: Diagnosis not present

## 2022-12-07 DIAGNOSIS — Z7984 Long term (current) use of oral hypoglycemic drugs: Secondary | ICD-10-CM | POA: Diagnosis not present

## 2022-12-07 DIAGNOSIS — E669 Obesity, unspecified: Secondary | ICD-10-CM

## 2022-12-07 DIAGNOSIS — Z7985 Long-term (current) use of injectable non-insulin antidiabetic drugs: Secondary | ICD-10-CM | POA: Diagnosis not present

## 2022-12-07 LAB — POCT GLYCOSYLATED HEMOGLOBIN (HGB A1C): Hemoglobin A1C: 6.3 % — AB (ref 4.0–5.6)

## 2022-12-07 LAB — MICROALBUMIN / CREATININE URINE RATIO
Creatinine,U: 70 mg/dL
Microalb Creat Ratio: 2.9 mg/g (ref 0.0–30.0)
Microalb, Ur: 2.1 mg/dL — ABNORMAL HIGH (ref 0.0–1.9)

## 2022-12-07 NOTE — Addendum Note (Signed)
Addended by: Tera Partridge on: 12/07/2022 09:00 AM   Modules accepted: Orders

## 2022-12-07 NOTE — Progress Notes (Signed)
Patient ID: Destiny Munoz, female   DOB: May 25, 1955, 67 y.o.   MRN: [id number]   HPI: Destiny Munoz is a 67 y.o.-year-old female, presenting for follow-up for DM2, dx as GDM - 1995, then as DM2 in 2010, insulin-dependent since 05/2016, uncontrolled, without long-term complications. Last visit 6 months ago. She changed her insurance in 2021.  Interim history: No increased urination, blurry vision, nausea, chest pain. She had poison ivy rash in 07/2022 - had steroid inj. and tapers. Sugars were higher. She went to [place] and [place] after our last OV.  Reviewed HbA1c levels: Lab Results  Component Value Date   HGBA1C 6.5 (A) 06/07/2022   HGBA1C 6.3 (A) 12/09/2021   HGBA1C 6.4 (A) 04/08/2021  04/05/2022: HbA1c 6.3% 07/13/2021: HbA1c 6.3% 07/14/2016: HbA1c 10.1% 04/14/2016: HbA1c 11.3% 01/13/2016: HbA1c 10.4% 09/30/2015: HbA1c 10.9%  She is on: - Tresiba 50 units daily - Glipizide 5 mg before larger dinner >> occasionally  - Farxiga 10 mg before breakfast - Ozempic 0.5 mg weekly - started 03/2020 >> 1 mg weekly She tried metformin R and ER, but it caused GI upset (N/D) Previously on glipizide ER 10 mg.   She was on Lantus 30 units at bedtime in the past. She was previously on NPH due to price.  Pt checks her sugars twice a day: - am:  92, 118-182 >> 97, 110-148, 161 >> 107-140 >> 96, 101-120, 140 - 2h after b'fast: n/c - before lunch:  107-129 >> n/c - 2h after lunch: n/c - before dinner: 120-127 >> n/c >> 125-130 >> n/c - 2h after dinner: 148-240  >> n/c - bedtime: 118-169, 191, 240 >> 130-160 >> 140-179 - nighttime: n/c Lowest sugar was  77 >> ... 89 >> 96; she has hypoglycemia awareness at 100. Highest sugar was 240 >> 208 >> <200.  Glucometer: [employer]  Pt's meals are: - Breakfast: protein shake, cheese, sliced meat, toast - break: yoghurt - Lunch: sandwich or hamburger - break: banana - Dinner: chicken/steak + salad + starch - Dessert:  cookies She does not like vegetables and fruits.  -No CKD, last BUN/creatinine:  No results found for: "BUN", "CREATININE"  On lisinopril.  -+ HL; last set of lipids: 08/09/2022: 198/108/55/124 07/13/2021: 174/85/55/103 06/05/2020: 165/82/53/97 07/23/2019: 169/104/53/97 10/04/2016: 178/90/42/117 09/30/2015: 207/110/49/136 No results found for: "CHOL", "HDL", "LDLCALC", "LDLDIRECT", "TRIG", "CHOLHDL"  On pravastatin 20 >> 40 mg daily  - last eye exam was in 08/2022: No DR reportedly  - she denies numbness and tingling in her feet.  + Callus.  She sees podiatry.  Last foot exam 06/07/2022.  She had mm spasms in neck - saw chiropractor in the past.  These resolved with heat. 08/09/2022: TSH 1.57  ROS: + SEE hpi  I reviewed pt's medications, allergies, PMH, social hx, family hx, and changes were documented in the history of present illness. Otherwise, unchanged from my initial visit note.  PMH: - DM2 - HTN - HL - Depression  No past surgical history on file.   Social History   Social History   Marital status: single    Spouse name: N/A   Number of children: 5   Occupational History   Financial trader   Social History Main Topics   Smoking status: No   Smokeless tobacco: No   Alcohol use Wine, 2x weekly   Drug use: No   Current Outpatient Medications  Medication Sig Dispense Refill   dapagliflozin propanediol (FARXIGA) 10 MG TABS tablet TAKE ONE TABLET BY  MOUTH DAILY BEFORE BREAKFAST 90 tablet 3   glipiZIDE (GLUCOTROL) 5 MG tablet Take 1 tablet (5 mg total) by mouth daily before supper. 90 tablet 3   insulin degludec (TRESIBA FLEXTOUCH) 200 UNIT/ML FlexTouch Pen INJECT 50 UNITS INTO THE SKIN DAILY 6 mL 1   Insulin Pen Needle 32G X 4 MM MISC Use 1x a day 100 each 3   lisinopril (PRINIVIL,ZESTRIL) 10 MG tablet Take 10 mg by mouth daily.     OZEMPIC, 1 MG/DOSE, 4 MG/3ML SOPN DIAL AND INJECT UNDER THE SKIN 1 MG WEEKLY 3 mL 3   pravastatin (PRAVACHOL) 10  MG tablet Take 20 mg by mouth daily.      No current facility-administered medications for this visit.   No Known Allergies   Family History  Problem Relation Age of Onset   Diabetes Mother    Hypertension Mother    Hyperlipidemia Mother    Heart disease Mother    Hypertension Father    Hyperlipidemia Father    Heart disease Father    Cancer Father    Cancer Sister    Breast cancer Sister 23   Pt has FH of DM in mother.  PE: BP 124/80 (BP Location: Right Arm, Patient Position: Sitting, Cuff Size: Normal)   Pulse 80   Resp 18   Ht 5\' 6"  (1.676 m)   Wt 208 lb 3.2 oz (94.4 kg)   SpO2 99%   BMI 33.60 kg/m  Wt Readings from Last 3 Encounters:  12/07/22 208 lb 3.2 oz (94.4 kg)  06/07/22 208 lb (94.3 kg)  12/09/21 206 lb 9.6 oz (93.7 kg)   Constitutional: overweight, in NAD Eyes:  EOMI, no exophthalmos ENT: no neck masses, no cervical lymphadenopathy Cardiovascular: RRR, No MRG Respiratory: CTA B Musculoskeletal: no deformities Skin:no rashes Neurological: no tremor with outstretched hands  ASSESSMENT: 1. DM2, insulin-dependent, uncontrolled, without long term complications, but with hyperglycemia  2. HL  3.  Obesity class I  PLAN:  1. Patient with longstanding, previously uncontrolled type 2 diabetes, with improvement in control after she was able to restart Ozempic.  At last visit, HbA1c was slightly higher, at 6.5%, but still at goal.  Sugars were also at goal with only occasional slightly higher values in the morning if she had a late dinner.  We discussed about using glipizide in these situations.  She had a larger lunches occasionally and I advised her to try to use glipizide before this meal, also, if needed.  We did not change the regimen otherwise. -At today's visit, her blood sugars remain at goal.  She feels that she is doing a better job with exercise, walking more frequently.  No need to change her regimen for now, especially in the setting of a decreased  HbA1c (see below). - I suggested to:  Patient Instructions  Please continue: - Farxiga 10 mg before b'fast - Glipizide 2.5-5 mg as needed 20-30 min before a larger meal - Tresiba 50 units daily  - Ozempic 1 mg weekly  Please return in 4-6 months with your sugar log   - we checked her HbA1c: 6.3% (lower) - advised to check sugars at different times of the day - 1x a day, rotating check times - advised for yearly eye exams >> she is UTD - will check an ACR today - return to clinic in 4-6 months  2. HL - Reviewed latest lipid panel from 07/2022: Fractions at goal, with exception of a high LDL No results found for: "  CHOL", "HDL", "LDLCALC", "LDLDIRECT", "TRIG", "CHOLHDL" - Continues pravastatin 40 mg daily without side effects.  3.  Obesity class I -continue SGLT 2 inhibitor and GLP-1 receptor agonist which should also help with weight loss -She gained 2 pounds before last visit, but weight is stable since then  Carlus Pavlov, MD PhD [facility] Endocrinology

## 2022-12-07 NOTE — Patient Instructions (Signed)
Please continue: - Farxiga 10 mg before b'fast - Glipizide 2.5-5 mg as needed 20-30 min before a larger meal - Tresiba 50 units daily  - Ozempic 1 mg weekly  Please return in 4-6 months with your sugar log

## 2022-12-12 ENCOUNTER — Other Ambulatory Visit: Payer: Self-pay | Admitting: Internal Medicine

## 2022-12-12 DIAGNOSIS — Z794 Long term (current) use of insulin: Secondary | ICD-10-CM

## 2023-02-24 ENCOUNTER — Other Ambulatory Visit: Payer: Self-pay | Admitting: Internal Medicine

## 2023-02-24 DIAGNOSIS — E1165 Type 2 diabetes mellitus with hyperglycemia: Secondary | ICD-10-CM

## 2023-02-24 DIAGNOSIS — Z794 Long term (current) use of insulin: Secondary | ICD-10-CM

## 2023-03-07 ENCOUNTER — Other Ambulatory Visit: Payer: Self-pay | Admitting: Internal Medicine

## 2023-03-11 ENCOUNTER — Other Ambulatory Visit: Payer: Self-pay | Admitting: Internal Medicine

## 2023-04-13 ENCOUNTER — Other Ambulatory Visit: Payer: Self-pay | Admitting: Internal Medicine

## 2023-04-13 DIAGNOSIS — Z794 Long term (current) use of insulin: Secondary | ICD-10-CM

## 2023-06-07 ENCOUNTER — Ambulatory Visit: Payer: BC Managed Care – PPO | Admitting: Internal Medicine

## 2023-06-10 ENCOUNTER — Other Ambulatory Visit: Payer: Self-pay | Admitting: Internal Medicine

## 2023-07-20 ENCOUNTER — Ambulatory Visit: Admitting: Internal Medicine

## 2023-07-20 ENCOUNTER — Encounter: Payer: Self-pay | Admitting: Internal Medicine

## 2023-07-20 VITALS — BP 136/70 | HR 99 | Ht 66.0 in | Wt 206.4 lb

## 2023-07-20 DIAGNOSIS — E66811 Obesity, class 1: Secondary | ICD-10-CM | POA: Diagnosis not present

## 2023-07-20 DIAGNOSIS — E1165 Type 2 diabetes mellitus with hyperglycemia: Secondary | ICD-10-CM

## 2023-07-20 DIAGNOSIS — E785 Hyperlipidemia, unspecified: Secondary | ICD-10-CM

## 2023-07-20 DIAGNOSIS — Z794 Long term (current) use of insulin: Secondary | ICD-10-CM

## 2023-07-20 LAB — POCT GLYCOSYLATED HEMOGLOBIN (HGB A1C): Hemoglobin A1C: 6.1 % — AB (ref 4.0–5.6)

## 2023-07-20 NOTE — Patient Instructions (Addendum)
 Please continue: - Farxiga  10 mg before b'fast - Glipizide  2.5-5 mg as needed 20-30 min before a larger meal - Tresiba  50 units daily  - Ozempic  1 mg weekly  Check some sugars later in the day (after lunch, before and after dinner).  Please return in 4-6 months with your sugar log

## 2023-07-20 NOTE — Progress Notes (Signed)
 Patient ID: Destiny Munoz, female   DOB: 05/15/1955, 68 y.o.   MRN: [id number]   HPI: Destiny Munoz is a 68 y.o.-year-old female, presenting for follow-up for DM2, dx as GDM - 1995, then as DM2 in 2010, insulin -dependent since 05/2016, uncontrolled, without long-term complications. Last visit 8 months ago. She changed her insurance in 2021.  Interim history: No increased urination, blurry vision, nausea, chest pain. Described difficulty standing up due to weakness.  Reviewed HbA1c levels: Lab Results  Component Value Date   HGBA1C 6.3 (A) 12/07/2022   HGBA1C 6.5 (A) 06/07/2022   HGBA1C 6.3 (A) 12/09/2021  04/05/2022: HbA1c 6.3% 07/13/2021: HbA1c 6.3% 07/14/2016: HbA1c 10.1% 04/14/2016: HbA1c 11.3% 01/13/2016: HbA1c 10.4% 09/30/2015: HbA1c 10.9%  She is on: - Tresiba  50 units daily - Glipizide  5 mg before larger dinner   - Farxiga  10 mg before breakfast - Ozempic  0.5 mg weekly - started 03/2020 >> 1 mg weekly She tried metformin R and ER, but it caused GI upset (N/D) Previously on glipizide  ER 10 mg.   She was on Lantus 30 units at bedtime in the past. She was previously on NPH due to price.  Pt checks her sugars twice a day: - am:  107-140 >> 96, 101-120, 140 >> 92-135, 144, 151, 165 (eating dinner late) - 2h after b'fast: n/c - before lunch:  107-129 >> n/c - 2h after lunch: n/c - before dinner: 120-127 >> n/c >> 125-130 >> n/c - 2h after dinner: 148-240  >> n/c - bedtime: 118-169, 191, 240 >> 130-160 >> 140-179 >> n/c - nighttime: n/c Lowest sugar was  77 >> ... 89 >> 96 >> 92; she has hypoglycemia awareness at 100. Highest sugar was 240 >> 208 >> <200 >> 165.  Glucometer: [employer]  Pt's meals are: - Breakfast: protein shake, cheese, sliced meat, toast - break: yoghurt - Lunch: sandwich or hamburger - break: banana - Dinner: chicken/steak + salad + starch - Dessert: cookies She does not like vegetables and fruits.  -No CKD, last  BUN/creatinine:  No results found for: "BUN", "CREATININE"  Lab Results  Component Value Date   MICRALBCREAT 2.9 12/07/2022  On lisinopril.  -+ HL; last set of lipids: 08/09/2022: 198/108/55/124 No results found for: "CHOL", "HDL", "LDLCALC", "LDLDIRECT", "TRIG", "CHOLHDL"  On pravastatin 20 >> 40 mg daily  - last eye exam was in 08/2022: No DR reportedly  - she denies numbness and tingling in her feet.  + Callus.  She sees podiatry.  Last foot exam 06/07/2022.  She had mm spasms in neck - saw chiropractor in the past.  These resolved with heat. 08/09/2022: TSH 1.57  ROS: + SEE hpi  I reviewed pt's medications, allergies, PMH, social hx, family hx, and changes were documented in the history of present illness. Otherwise, unchanged from my initial visit note.  PMH: - DM2 - HTN - HL - Depression  No past surgical history on file.   Social History   Social History   Marital status: single    Spouse name: N/A   Number of children: 5   Occupational History   Financial trader   Social History Main Topics   Smoking status: No   Smokeless tobacco: No   Alcohol use Wine, 2x weekly   Drug use: No   Current Outpatient Medications  Medication Sig Dispense Refill   dapagliflozin  propanediol (FARXIGA ) 10 MG TABS tablet TAKE 1 TABLET BY MOUTH DAILY BEFORE BREAKFAST 90 tablet 3   glipiZIDE  (GLUCOTROL )  5 MG tablet Take 1 tablet (5 mg total) by mouth daily before supper. 90 tablet 3   insulin  degludec (TRESIBA  FLEXTOUCH) 200 UNIT/ML FlexTouch Pen INJECT 50 UNITS INTO THE SKIN DAILY 18 mL 3   Insulin  Pen Needle 32G X 4 MM MISC Use 1x a day 100 each 3   lisinopril (PRINIVIL,ZESTRIL) 10 MG tablet Take 10 mg by mouth daily.     pravastatin (PRAVACHOL) 10 MG tablet Take 20 mg by mouth daily.      Semaglutide , 1 MG/DOSE, (OZEMPIC , 1 MG/DOSE,) 4 MG/3ML SOPN DIAL AND INJECT UNDER THE SKIN 1 MG WEEKLY 9 mL 3   No current facility-administered medications for this visit.    No Known Allergies   Family History  Problem Relation Age of Onset   Diabetes Mother    Hypertension Mother    Hyperlipidemia Mother    Heart disease Mother    Hypertension Father    Hyperlipidemia Father    Heart disease Father    Cancer Father    Cancer Sister    Breast cancer Sister 71   Pt has FH of DM in mother.  PE: BP 136/70   Pulse 99   Ht 5\' 6"  (1.676 m)   Wt 206 lb 6.4 oz (93.6 kg)   SpO2 97%   BMI 33.31 kg/m  Wt Readings from Last 3 Encounters:  07/20/23 206 lb 6.4 oz (93.6 kg)  12/07/22 208 lb 3.2 oz (94.4 kg)  06/07/22 208 lb (94.3 kg)   Constitutional: overweight, in NAD Eyes:  EOMI, no exophthalmos ENT: no neck masses, no cervical lymphadenopathy Cardiovascular: tachycardia, RR, No MRG Respiratory: CTA B Musculoskeletal: no deformities Skin:no rashes Neurological: no tremor with outstretched hands Diabetic Foot Exam - Simple   Simple Foot Form Diabetic Foot exam was performed with the following findings: Yes 07/20/2023 11:02 AM  Visual Inspection No deformities, no ulcerations, no other skin breakdown bilaterally: Yes Sensation Testing Intact to touch and monofilament testing bilaterally: Yes Pulse Check Posterior Tibialis and Dorsalis pulse intact bilaterally: Yes Comments + B medial hallux calluses    ASSESSMENT: 1. DM2, insulin -dependent, uncontrolled, without long term complications, but with hyperglycemia  2. HL  3.  Obesity class I  PLAN:  1. Patient with longstanding, previously uncontrolled, type 2 diabetes, with improvement in control after she was able to restart Ozempic .  HbA1c at last check was lower, at 6.3%.  She felt that she was doing a better job with exercise, walking more frequently.  Blood sugars were at goal so we did not change her regimen. -At today's visit she is only checking sugars in the morning.  Needs are mostly at goal but she does have hyperglycemic exceptions in the 130s and even up to 160s.  She mentions  that this happens when she is eating dinner is late.  She is not usually taking the glipizide  and I did advise her to do so.  Given examples of meals that could benefit from the premeal glipizide  dosing.  Since she does not have sugars later in the day, I was not able to reduce her Tresiba  dose now but we discussed about trying to do so at next visit.  I advised her to check sugars at different times of the day and given the sugar log. - I suggested to:  Patient Instructions  Please continue: - Farxiga  10 mg before b'fast - Glipizide  2.5-5 mg as needed 20-30 min before a larger meal - Tresiba  50 units daily  - Ozempic   1 mg weekly  Check some sugars later in the day (after lunch, before and after dinner).  Please return in 4-6 months with your sugar log   - we checked her HbA1c: 6.1% (lower) - advised for yearly eye exams >> she is UTD - return to clinic in 4-6 months  2. HL - Latest lipid panel was reviewed from 07/2022: Fractions at goal with the exception of a high LDL No results found for: "CHOL", "HDL", "LDLCALC", "LDLDIRECT", "TRIG", "CHOLHDL" - She continues on pravastatin 40 mg daily without side effects - she will need to have an annual physical exam with PCP this month.  She will call to schedule this.  3.  Obesity class I -continue SGLT 2 inhibitor and GLP-1 receptor agonist which should also help with weight loss - wt was stable at last visit, previously gained 2 pounds - at today's visit, she lost 2 pounds - We discussed about the importance of starting exercise, especially as she also describes muscle weakness.  Emilie Harden, MD PhD [facility] Endocrinology

## 2023-07-21 ENCOUNTER — Encounter: Payer: Self-pay | Admitting: Internal Medicine

## 2023-07-21 LAB — MICROALBUMIN / CREATININE URINE RATIO
Creatinine, Urine: 79 mg/dL (ref 20–275)
Microalb Creat Ratio: 9 mg/g{creat} (ref <30)
Microalb, Ur: 0.7 mg/dL

## 2023-08-29 DIAGNOSIS — E785 Hyperlipidemia, unspecified: Secondary | ICD-10-CM | POA: Diagnosis not present

## 2023-08-29 DIAGNOSIS — E1169 Type 2 diabetes mellitus with other specified complication: Secondary | ICD-10-CM | POA: Diagnosis not present

## 2023-08-29 DIAGNOSIS — I10 Essential (primary) hypertension: Secondary | ICD-10-CM | POA: Diagnosis not present

## 2023-08-29 DIAGNOSIS — R011 Cardiac murmur, unspecified: Secondary | ICD-10-CM | POA: Diagnosis not present

## 2023-10-02 ENCOUNTER — Other Ambulatory Visit: Payer: Self-pay | Admitting: Family Medicine

## 2023-10-02 DIAGNOSIS — Z1231 Encounter for screening mammogram for malignant neoplasm of breast: Secondary | ICD-10-CM

## 2023-10-17 ENCOUNTER — Ambulatory Visit
Admission: RE | Admit: 2023-10-17 | Discharge: 2023-10-17 | Disposition: A | Discharge status: Home / Self Care | Source: Ambulatory Visit | Attending: Family Medicine | Admitting: Family Medicine

## 2023-10-17 DIAGNOSIS — Z1231 Encounter for screening mammogram for malignant neoplasm of breast: Secondary | ICD-10-CM

## 2023-10-26 DIAGNOSIS — R222 Localized swelling, mass and lump, trunk: Secondary | ICD-10-CM | POA: Diagnosis not present

## 2023-10-30 DIAGNOSIS — J9811 Atelectasis: Secondary | ICD-10-CM | POA: Diagnosis not present

## 2023-10-30 DIAGNOSIS — R222 Localized swelling, mass and lump, trunk: Secondary | ICD-10-CM | POA: Diagnosis not present

## 2023-10-30 DIAGNOSIS — J984 Other disorders of lung: Secondary | ICD-10-CM | POA: Diagnosis not present

## 2023-11-29 DIAGNOSIS — R222 Localized swelling, mass and lump, trunk: Secondary | ICD-10-CM | POA: Diagnosis not present

## 2024-01-03 LAB — OPHTHALMOLOGY REPORT-SCANNED

## 2024-01-22 ENCOUNTER — Encounter: Payer: Self-pay | Admitting: Internal Medicine

## 2024-01-22 ENCOUNTER — Ambulatory Visit: Admitting: Internal Medicine

## 2024-01-22 VITALS — BP 118/60 | HR 85 | Ht 66.0 in | Wt 208.2 lb

## 2024-01-22 DIAGNOSIS — Z794 Long term (current) use of insulin: Secondary | ICD-10-CM

## 2024-01-22 DIAGNOSIS — E1165 Type 2 diabetes mellitus with hyperglycemia: Secondary | ICD-10-CM

## 2024-01-22 DIAGNOSIS — E66811 Obesity, class 1: Secondary | ICD-10-CM

## 2024-01-22 DIAGNOSIS — E785 Hyperlipidemia, unspecified: Secondary | ICD-10-CM | POA: Diagnosis not present

## 2024-01-22 LAB — POCT GLYCOSYLATED HEMOGLOBIN (HGB A1C): Hemoglobin A1C: 6 % — AB (ref 4.0–5.6)

## 2024-01-22 MED ORDER — DAPAGLIFLOZIN PROPANEDIOL 10 MG PO TABS
10.0000 mg | ORAL_TABLET | Freq: Every day | ORAL | 3 refills | Status: AC
Start: 2024-01-22 — End: ?

## 2024-01-22 MED ORDER — TRESIBA FLEXTOUCH 200 UNIT/ML ~~LOC~~ SOPN: 50.0000 [IU] | PEN_INJECTOR | Freq: Every day | SUBCUTANEOUS | 3 refills | Status: AC

## 2024-01-22 MED ORDER — OZEMPIC (1 MG/DOSE) 4 MG/3ML ~~LOC~~ SOPN: 1.0000 mg | PEN_INJECTOR | SUBCUTANEOUS | 3 refills | Status: AC

## 2024-01-22 NOTE — Patient Instructions (Addendum)
 Please continue: - Farxiga  10 mg before b'fast - Tresiba  50 units daily  - Ozempic  1 mg weekly  Please add: - Glipizide  2.5-5 mg as needed 20-30 min before a larger meal   Please return in 4-6 months with your sugar log

## 2024-01-22 NOTE — Progress Notes (Signed)
 Patient ID: Carmel Garfield, female   DOB: 1955/06/11, 68 y.o.   MRN: [id number]   HPI: Gretna Bergin is a 68 y.o.-year-old female, presenting for follow-up for DM2, dx as GDM - 1995, then as DM2 in 2010, insulin -dependent since 05/2016, uncontrolled, without long-term complications. Last visit 6 months ago.  Interim history: No increased urination, blurry vision, nausea, chest pain.  Reviewed HbA1c levels: Lab Results  Component Value Date   HGBA1C 6.1 (A) 07/20/2023   HGBA1C 6.3 (A) 12/07/2022   HGBA1C 6.5 (A) 06/07/2022  04/05/2022: HbA1c 6.3% 07/13/2021: HbA1c 6.3% 07/14/2016: HbA1c 10.1% 04/14/2016: HbA1c 11.3% 01/13/2016: HbA1c 10.4% 09/30/2015: HbA1c 10.9%  She is on: - Tresiba  50 units daily - Glipizide  5 mg before larger dinner >> 2.5-5 mg as needed 20-30 min before a larger meal  - Farxiga  10 mg before breakfast - Ozempic  0.5 mg weekly - started 03/2020 >> 1 mg weekly She tried metformin R and ER, but it caused GI upset (N/D) Previously on glipizide  ER 10 mg.   She was on Lantus 30 units at bedtime in the past. She was previously on NPH due to price.  Pt checks her sugars twice a day: - am: 92-135, 144, 151, 165 (late dinner) >> 89-142, 156 - 2h after b'fast: n/c - before lunch:  107-129 >> n/c - 2h after lunch: n/c - before dinner: 120-127 >> n/c >> 125-130 >> n/c - 2h after dinner: 148-240  >> n/c - bedtime: 130-160 >> 140-179 >> n/c >> 112-163, 170 - nighttime: n/c Lowest sugar was  77 >> ... 96 >> 92 >> 95; she has hypoglycemia awareness at 100. Highest sugar was 240 >> ... 165 >> 170.  Glucometer: [employer]  Pt's meals are: - Breakfast: protein shake, cheese, sliced meat, toast - break: yoghurt - Lunch: sandwich or hamburger - break: banana - Dinner: chicken/steak + salad + starch - Dessert: cookies She does not like vegetables and fruits.  -No CKD, last BUN/creatinine: 08/30/2023: 12/0.76, GFR 86, Glu 103 No results found for:  BUN, CREATININE  Lab Results  Component Value Date   MICRALBCREAT 9 07/20/2023  On lisinopril.  -+ HL; last set of lipids:  08/30/2023: 161/79/53/93 08/09/2022: 198/108/55/124 No results found for: CHOL, HDL, LDLCALC, LDLDIRECT, TRIG, CHOLHDL  On pravastatin 20 >> 40 mg daily  - last eye exam was in 11/2023: No DR reportedly  - she denies numbness and tingling in her feet.  + Callus.  She sees podiatry.  Last foot exam 07/20/2023.  She had mm spasms in neck - saw chiropractor in the past.  These resolved with heat. 08/30/2023: TSH 1.84 08/09/2022: TSH 1.57  ROS: + SEE hpi  I reviewed pt's medications, allergies, PMH, social hx, family hx, and changes were documented in the history of present illness. Otherwise, unchanged from my initial visit note.  PMH: - DM2 - HTN - HL - Depression  No past surgical history on file.   Social History   Social History   Marital status: single    Spouse name: N/A   Number of children: 5   Occupational History   Financial trader   Social History Main Topics   Smoking status: No   Smokeless tobacco: No   Alcohol use Wine, 2x weekly   Drug use: No   Current Outpatient Medications  Medication Sig Dispense Refill   dapagliflozin  propanediol (FARXIGA ) 10 MG TABS tablet TAKE 1 TABLET BY MOUTH DAILY BEFORE BREAKFAST 90 tablet 3   glipiZIDE  (  GLUCOTROL ) 5 MG tablet Take 1 tablet (5 mg total) by mouth daily before supper. 90 tablet 3   insulin  degludec (TRESIBA  FLEXTOUCH) 200 UNIT/ML FlexTouch Pen INJECT 50 UNITS INTO THE SKIN DAILY 18 mL 3   Insulin  Pen Needle 32G X 4 MM MISC Use 1x a day 100 each 3   lisinopril (PRINIVIL,ZESTRIL) 10 MG tablet Take 10 mg by mouth daily.     pravastatin (PRAVACHOL) 10 MG tablet Take 20 mg by mouth daily.      Semaglutide , 1 MG/DOSE, (OZEMPIC , 1 MG/DOSE,) 4 MG/3ML SOPN DIAL AND INJECT UNDER THE SKIN 1 MG WEEKLY 9 mL 3   No current facility-administered medications for this  visit.   No Known Allergies   Family History  Problem Relation Age of Onset   Diabetes Mother    Hypertension Mother    Hyperlipidemia Mother    Heart disease Mother    Hypertension Father    Hyperlipidemia Father    Heart disease Father    Cancer Father    Cancer Sister    Breast cancer Sister 62   Pt has FH of DM in mother.  PE: BP 118/60   Pulse 85   Ht 5' 6 (1.676 m)   Wt 208 lb 3.2 oz (94.4 kg)   SpO2 97%   BMI 33.60 kg/m  Wt Readings from Last 3 Encounters:  01/22/24 208 lb 3.2 oz (94.4 kg)  07/20/23 206 lb 6.4 oz (93.6 kg)  12/07/22 208 lb 3.2 oz (94.4 kg)   Constitutional: overweight, in NAD Eyes:  EOMI, no exophthalmos ENT: no neck masses, no cervical lymphadenopathy Cardiovascular: RRR, No MRG Respiratory: CTA B Musculoskeletal: no deformities Skin:no rashes Neurological: no tremor with outstretched hands  ASSESSMENT: 1. DM2, insulin -dependent, uncontrolled, without long term complications, but with hyperglycemia  2. HL  3.  Obesity class I  PLAN:  1. Patient with longstanding, previously uncontrolled type 2 diabetes, with improvement in control after he was able to start Ozempic .  HbA1c was excellent at last visit, at 6.1%, improved.  She was not checking sugars later in the day, only in the morning, and these were mostly at goal but she did have some hyperglycemic values, even up to 160s.  She was not taking the glipizide  before a larger meal and I advised him to do so but otherwise did not change the regimen. - At today's visit, sugars appear to be improved in the morning, with only 2 or 3 values in the last 2 weeks being slightly above target.  Sugars are at goal when she checks at bedtime.  We did discuss about using the glipizide  dose before a larger meal especially with the holidays coming up but she does not need any other changes in her regimen for now.  I refilled her main diabetic prescriptions. - I suggested to:  Patient Instructions  Please  continue: - Farxiga  10 mg before b'fast - Tresiba  50 units daily  - Ozempic  1 mg weekly  Please add: - Glipizide  2.5-5 mg as needed 20-30 min before a larger meal   Please return in 4-6 months with your sugar log   - we checked her HbA1c: 6.0% (lower) - advised to check sugars at different times of the day - 1x a day, rotating check times - advised for yearly eye exams >> she is UTD - return to clinic in 4-6 months  2. HL - Latest lipid panel was reviewed from 08/30/2023: 161/79/53/93: Elevated LDL, otherwise fractions at goal No  results found for: CHOL, HDL, LDLCALC, LDLDIRECT, TRIG, CHOLHDL - She continues pravastatin 40 mg daily without side effects  3.  Obesity class I - will continue the SGLT2 inhibitor and GLP-1 receptor agonist, which should also help with weight loss - wt was stable at last visit, previously gained 2 pounds - Lost 2 pounds before last visit but gained them back since then  Lela Fendt, MD PhD [facility] End[facility]

## 2024-01-22 NOTE — Addendum Note (Signed)
 Addended by: CLEOTILDE ROLIN RAMAN on: 01/22/2024 09:03 AM   Modules accepted: Orders

## 2024-01-30 ENCOUNTER — Other Ambulatory Visit: Payer: Self-pay | Admitting: Internal Medicine

## 2024-01-30 DIAGNOSIS — Z794 Long term (current) use of insulin: Secondary | ICD-10-CM

## 2024-02-01 ENCOUNTER — Encounter: Payer: Self-pay | Admitting: *Deleted

## 2024-02-01 NOTE — Progress Notes (Signed)
 Breland Elders                                          MRN: [id number]   02/01/2024   The [facility] Quality Team Specialist reviewed this patient medical record for the purposes of chart review for care gap closure. The following were reviewed: chart review for care gap closure-diabetic eye exam.    [facility] Quality Team

## 2024-02-21 ENCOUNTER — Other Ambulatory Visit (HOSPITAL_COMMUNITY): Payer: Self-pay

## 2024-07-23 ENCOUNTER — Ambulatory Visit: Admitting: Internal Medicine
